# Patient Record
Sex: Female | Born: 1980 | Race: Black or African American | Hispanic: No | Marital: Single | Smoking: Never smoker
Health system: Southern US, Community
[De-identification: ages and names within clinical notes are randomized; demographics above are authoritative.]

## PROBLEM LIST (undated history)

## (undated) DIAGNOSIS — D649 Anemia, unspecified: Secondary | ICD-10-CM

## (undated) DIAGNOSIS — K589 Irritable bowel syndrome without diarrhea: Secondary | ICD-10-CM

## (undated) DIAGNOSIS — J45909 Unspecified asthma, uncomplicated: Secondary | ICD-10-CM

## (undated) DIAGNOSIS — K439 Ventral hernia without obstruction or gangrene: Secondary | ICD-10-CM

## (undated) DIAGNOSIS — F32A Depression, unspecified: Secondary | ICD-10-CM

## (undated) DIAGNOSIS — K3184 Gastroparesis: Secondary | ICD-10-CM

## (undated) DIAGNOSIS — Z8679 Personal history of other diseases of the circulatory system: Secondary | ICD-10-CM

## (undated) DIAGNOSIS — F329 Major depressive disorder, single episode, unspecified: Secondary | ICD-10-CM

## (undated) DIAGNOSIS — Z973 Presence of spectacles and contact lenses: Secondary | ICD-10-CM

## (undated) DIAGNOSIS — K4090 Unilateral inguinal hernia, without obstruction or gangrene, not specified as recurrent: Secondary | ICD-10-CM

## (undated) DIAGNOSIS — L309 Dermatitis, unspecified: Secondary | ICD-10-CM

## (undated) DIAGNOSIS — F419 Anxiety disorder, unspecified: Secondary | ICD-10-CM

## (undated) DIAGNOSIS — R519 Headache, unspecified: Secondary | ICD-10-CM

## (undated) DIAGNOSIS — G709 Myoneural disorder, unspecified: Secondary | ICD-10-CM

## (undated) HISTORY — DX: Dermatitis, unspecified: L30.9

## (undated) HISTORY — PX: HERNIA REPAIR: SHX51

---

## 2016-10-04 ENCOUNTER — Emergency Department (HOSPITAL_COMMUNITY)
Admission: EM | Admit: 2016-10-04 | Discharge: 2016-10-04 | Disposition: A | Discharge status: Home / Self Care | Payer: Self-pay | Attending: Emergency Medicine | Admitting: Emergency Medicine

## 2016-10-04 ENCOUNTER — Encounter (HOSPITAL_COMMUNITY): Payer: Self-pay

## 2016-10-04 DIAGNOSIS — Z79899 Other long term (current) drug therapy: Secondary | ICD-10-CM | POA: Insufficient documentation

## 2016-10-04 DIAGNOSIS — R0982 Postnasal drip: Secondary | ICD-10-CM | POA: Insufficient documentation

## 2016-10-04 DIAGNOSIS — J45909 Unspecified asthma, uncomplicated: Secondary | ICD-10-CM | POA: Insufficient documentation

## 2016-10-04 DIAGNOSIS — J029 Acute pharyngitis, unspecified: Secondary | ICD-10-CM | POA: Insufficient documentation

## 2016-10-04 HISTORY — DX: Irritable bowel syndrome, unspecified: K58.9

## 2016-10-04 HISTORY — DX: Unspecified asthma, uncomplicated: J45.909

## 2016-10-04 HISTORY — DX: Gastroparesis: K31.84

## 2016-10-04 LAB — RAPID STREP SCREEN (MED CTR MEBANE ONLY): STREPTOCOCCUS, GROUP A SCREEN (DIRECT): NEGATIVE

## 2016-10-04 MED ORDER — BENZONATATE 100 MG PO CAPS: ORAL_CAPSULE | ORAL | 1 refills | Status: DC

## 2016-10-04 MED ORDER — NAPROXEN 500 MG PO TABS: 500.0000 mg | ORAL_TABLET | Freq: Two times a day (BID) | ORAL | 0 refills | Status: DC

## 2016-10-04 NOTE — Discharge Instructions (Signed)
The rapid strep test was negative. Your symptoms are consistent with a viral illness complicated by sinus congestion and drainage, likely due to the environment. Viruses do not require antibiotics. Treatment is symptomatic care. Drink plenty of fluids and get plenty of rest. You should be drinking at least half a liter of water an hour to stay hydrated. Electrolyte drinks are also encouraged. Ibuprofen, Naproxen, or Tylenol for pain or fever. Tessalon for cough. Plain Mucinex or phenylephrine (Sudafed) may help relieve congestion. Sinus rinses to alleviate sinus congestion. Warm liquids or Chloraseptic spray may help soothe a sore throat. Follow up with a primary care provider, as needed, for any future management of this issue.

## 2016-10-04 NOTE — ED Provider Notes (Signed)
[facility] DEPT Provider Note   CSN: [id number] Arrival date & time: 10/04/16  1057  By signing my name below, I, [name], attest that this documentation has been prepared under the direction and in the presence of Jacen Carlini, PA-C. Electronically Signed: [name] , ED Scribe. 10/04/16. 12:23 PM.   History   Chief Complaint Chief Complaint  Patient presents with  . sore throat/cough    HPI Virginia [name] is a 36 y.o. female with hx of asthma, thyroid cyst who presents to the Emergency Department complaining of intermittent mild to moderate sore throat with gradual onset x2 weeks. She also endorses nasal congestion, sinus congestion, postnasal drip, sneezing, and cough. This morning she looked at her throat in the mirror and thought her uvula looked swollen. Pt states that her sore throat is worse in the morning and has taken Tylenol cough and cold with mild relief. She denies fever/chills, N/V, chest pain, difficulty breathing or swallowing, or any other complaints.   Of note, this is pt[place] first winter in [place] after moving from [place].     The history is provided by the patient. No language interpreter was used.    Past Medical History:  Diagnosis Date  . Asthma   . Gastroparesis   . IBS (irritable bowel syndrome)     There are no active problems to display for this patient.   History reviewed. No pertinent surgical history.  OB History    No data available       Home Medications    Prior to Admission medications   Medication Sig Start Date End Date Taking? Authorizing Provider  benzonatate (TESSALON) 100 MG capsule Take 1-2 capsules (100-200 mg) every 8 hours as needed for cough. 10/04/16   Louine Tenpenny C Samy Ryner, PA-C  naproxen (NAPROSYN) 500 MG tablet Take 1 tablet (500 mg total) by mouth 2 (two) times daily. 10/04/16   Anselm PancoastShawn C Gurman [place], PA-C    Family History No family history on file.  Social History Social History  Substance Use Topics  . Smoking  status: Never Smoker  . Smokeless tobacco: Never Used  . Alcohol use Not on file     Allergies   Patient has no known allergies.   Review of Systems Review of Systems  Constitutional: Negative for chills and fever.  HENT: Positive for congestion, rhinorrhea, sinus pressure and sore throat. Negative for facial swelling and voice change.   Respiratory: Positive for cough. Negative for shortness of breath.   Cardiovascular: Negative for chest pain.  Gastrointestinal: Negative for abdominal pain, nausea and vomiting.  Neurological: Positive for headaches.  All other systems reviewed and are negative.    Physical Exam Updated Vital Signs BP 127/83 (BP Location: Left Arm)   Pulse 73   Temp 98.1 F (36.7 C) (Oral)   Resp 18   SpO2 100%   Physical Exam  Constitutional: She appears well-developed and well-nourished. No distress.  HENT:  Head: Normocephalic and atraumatic.  Mouth/Throat: Uvula is midline and mucous membranes are normal. No trismus in the jaw. No uvula swelling. Posterior oropharyngeal erythema present. No oropharyngeal exudate, posterior oropharyngeal edema or tonsillar abscesses.  No tenderness over the frontal or maxillary sinuses.  Eyes: Conjunctivae are normal.  Neck: Normal range of motion. Neck supple.  Cardiovascular: Normal rate, regular rhythm, normal heart sounds and intact distal pulses.   Pulmonary/Chest: Effort normal and breath sounds normal. No respiratory distress. She has no wheezes. She has no rhonchi.  Abdominal: Soft. There is no tenderness.  There is no guarding.  Musculoskeletal: She exhibits no edema.  Lymphadenopathy:    She has cervical adenopathy.  Neurological: She is alert.  Skin: Skin is warm and dry. She is not diaphoretic.  Psychiatric: She has a normal mood and affect. Her behavior is normal.  Nursing note and vitals reviewed.    ED Treatments / Results   DIAGNOSTIC STUDIES: Oxygen Saturation is 100% on RA, normal by my  interpretation.   COORDINATION OF CARE: 12:23 PM-Discussed next steps with pt. Pt verbalized understanding and is agreeable with the plan.    Labs (all labs ordered are listed, but only abnormal results are displayed) Labs Reviewed  RAPID STREP SCREEN (NOT AT [facility])  CULTURE, GROUP A STREP [facility])    EKG  EKG Interpretation None       Radiology No results found.  Procedures Procedures (including critical care time)  Medications Ordered in ED Medications - No data to display   Initial Impression / Assessment and Plan / ED Course  I have reviewed the triage vital signs and the nursing notes.  Pertinent labs & imaging results that were available during my care of the patient were reviewed by me and considered in my medical decision making (see chart for details).  Clinical Course     Patient's symptoms are consistent with a combination of possible URI and environmental allergies. Patient is nontoxic appearing and has normal vital signs. Rapid strep negative. Symptomatic care and return precautions discussed. Patient voiced understanding of all instructions and is comfortable with discharge.     Final Clinical Impressions(s) / ED Diagnoses   Final diagnoses:  Pharyngitis, unspecified etiology  Postnasal drip    New Prescriptions New Prescriptions   BENZONATATE (TESSALON) 100 MG CAPSULE    Take 1-2 capsules (100-200 mg) every 8 hours as needed for cough.   NAPROXEN (NAPROSYN) 500 MG TABLET    Take 1 tablet (500 mg total) by mouth 2 (two) times daily.   I personally performed the services described in this documentation, which was scribed in my presence. The recorded information has been reviewed and is accurate.    Anselm Pancoast, PA-C 10/04/16 1223    Lorre Nick, MD 10/06/16 [phone]

## 2016-10-04 NOTE — ED Notes (Signed)
Declined W/C at D/C and was escorted to lobby by RN. 

## 2016-10-04 NOTE — ED Triage Notes (Signed)
Patient here with sore throat and cough for the past week. States that the throat pain is better but noticed her uvula was swollen this am. NAD

## 2016-10-07 LAB — CULTURE, GROUP A STREP (THRC)

## 2017-02-25 ENCOUNTER — Encounter (HOSPITAL_COMMUNITY): Payer: Self-pay

## 2017-02-25 ENCOUNTER — Emergency Department (HOSPITAL_COMMUNITY)
Admission: EM | Admit: 2017-02-25 | Discharge: 2017-02-25 | Disposition: A | Discharge status: Home / Self Care | Payer: Self-pay | Attending: Emergency Medicine | Admitting: Emergency Medicine

## 2017-02-25 DIAGNOSIS — J45909 Unspecified asthma, uncomplicated: Secondary | ICD-10-CM | POA: Insufficient documentation

## 2017-02-25 DIAGNOSIS — Z79899 Other long term (current) drug therapy: Secondary | ICD-10-CM | POA: Insufficient documentation

## 2017-02-25 DIAGNOSIS — J069 Acute upper respiratory infection, unspecified: Secondary | ICD-10-CM | POA: Insufficient documentation

## 2017-02-25 DIAGNOSIS — J029 Acute pharyngitis, unspecified: Secondary | ICD-10-CM | POA: Insufficient documentation

## 2017-02-25 HISTORY — DX: Depression, unspecified: F32.A

## 2017-02-25 HISTORY — DX: Anxiety disorder, unspecified: F41.9

## 2017-02-25 HISTORY — DX: Ventral hernia without obstruction or gangrene: K43.9

## 2017-02-25 HISTORY — DX: Major depressive disorder, single episode, unspecified: F32.9

## 2017-02-25 LAB — RAPID STREP SCREEN (MED CTR MEBANE ONLY): Streptococcus, Group A Screen (Direct): NEGATIVE

## 2017-02-25 MED ORDER — ALBUTEROL SULFATE (2.5 MG/3ML) 0.083% IN NEBU
5.0000 mg | INHALATION_SOLUTION | Freq: Once | RESPIRATORY_TRACT | Status: AC
  Administered 2017-02-25: 5 mg via RESPIRATORY_TRACT
  Filled 2017-02-25: qty 6

## 2017-02-25 MED ORDER — IPRATROPIUM BROMIDE 0.02 % IN SOLN
0.5000 mg | Freq: Once | RESPIRATORY_TRACT | Status: AC
  Administered 2017-02-25: 0.5 mg via RESPIRATORY_TRACT
  Filled 2017-02-25: qty 2.5

## 2017-02-25 MED ORDER — AZITHROMYCIN 250 MG PO TABS: ORAL_TABLET | ORAL | 0 refills | Status: DC

## 2017-02-25 NOTE — ED Notes (Signed)
Patient agitated that she has been waiting in the lobby and now waiting in the room. Explained that provider would be in soon and that she is aware that patient is here and her medical complaints. Pt cursing and verbally voicing dissatisfaction stating "my asthma is acting up and I can't breath. Francesca OmanYall going to let me die." Advised patient to calm down and take deep breaths as this makes her breathing more stressful. Lungs sounds were assessed earlier and reassessed. Lungs clear, no wheezing, and patient obviously can talk in complete sentences. She calmed down and is now playing on her phone and laughing at a video by the time I departed from the room. Warm blanket given for comfort.

## 2017-02-25 NOTE — ED Triage Notes (Signed)
For over one week sore throat and pressure in ears no fever voiced no drooling non productive cough noted able to speak in full sentences no wheezing noted.

## 2017-02-27 LAB — CULTURE, GROUP A STREP (THRC)

## 2017-03-14 NOTE — ED Provider Notes (Signed)
[facility] Provider Note   CSN: [id number] Arrival date & time: 02/25/17  0209     History   Chief Complaint Chief Complaint  Patient presents with  . Sore Throat    HPI Virginia [name] is a 36 y.o. female.  Patient presents with complaint of sore throat, dry cough and ear pressure x 1 week. She feels her asthma is causing wheezing and SOB. No nausea, vomiting, headache, diarrhea. No fever at any time. She uses Albuterol by inhaler at home and states she has needed to use it more frequently recently.    The history is provided by the patient. No language interpreter was used.    Past Medical History:  Diagnosis Date  . Anxiety   . Asthma   . Depression   . Gastroparesis   . Hernia of abdominal wall   . IBS (irritable bowel syndrome)     There are no active problems to display for this patient.   History reviewed. No pertinent surgical history.  OB History    No data available       Home Medications    Prior to Admission medications   Medication Sig Start Date End Date Taking? Authorizing Provider  albuterol (PROVENTIL HFA;VENTOLIN HFA) 108 (90 Base) MCG/ACT inhaler Inhale 1-2 puffs into the lungs every 6 (six) hours as needed for wheezing or shortness of breath.   Yes [provider]  Phenyleph-Doxylamine-DM-APAP (TYLENOL COLD MULTI-SYMPTOM) 5-6.25-10-325 MG/15ML LIQD Take 15 mLs by mouth as needed (cold).   Yes [provider]  Pseudoephedrine-APAP-DM (DAYQUIL PO) Take 1 capsule by mouth every 6 (six) hours as needed (cold).   Yes [provider]  azithromycin (ZITHROMAX Z-PAK) 250 MG tablet Take 2 tablets on day 1 Take 1 tablet on days 2-5 02/25/17   Donnalyn Juran, PA-C  benzonatate (TESSALON) 100 MG capsule Take 1-2 capsules (100-200 mg) every 8 hours as needed for cough. Patient not taking: Reported on 02/25/2017 10/04/16   Joy, Ines Bloomer C, PA-C  naproxen (NAPROSYN) 500 MG tablet Take 1 tablet (500 mg total) by mouth 2 (two) times  daily. Patient not taking: Reported on 02/25/2017 10/04/16   Anselm Pancoast, PA-C    Family History History reviewed. No pertinent family history.  Social History Social History  Substance Use Topics  . Smoking status: Never Smoker  . Smokeless tobacco: Never Used  . Alcohol use Not on file     Allergies   Patient has no known allergies.   Review of Systems Review of Systems  Constitutional: Negative for chills and fever.  HENT: Positive for ear pain and sore throat. Negative for sinus pressure and trouble swallowing.   Respiratory: Positive for cough, shortness of breath and wheezing.   Cardiovascular: Negative.  Negative for chest pain.  Gastrointestinal: Negative.  Negative for nausea and vomiting.  Musculoskeletal: Negative.  Negative for myalgias.  Neurological: Negative.      Physical Exam Updated Vital Signs BP (!) 144/89 (BP Location: Left Arm)   Pulse 77   Temp 98.5 F (36.9 C) (Oral)   Resp 18   Ht 5\' 2"  (1.575 m)   Wt 80.3 kg (177 lb)   SpO2 100%   BMI 32.37 kg/m   Physical Exam  Constitutional: She is oriented to person, place, and time. She appears well-developed and well-nourished.  HENT:  Head: Normocephalic.  Oropharynx slightly erythematous. No significant tonsillar swelling and no exudates present. Uvula midline.  Neck: Normal range of motion. Neck supple.  Cardiovascular: Normal  rate and regular rhythm.   No murmur heard. Pulmonary/Chest: Effort normal and breath sounds normal. She has no wheezes. She has no rales.  Abdominal: Soft. Bowel sounds are normal. There is no tenderness. There is no rebound and no guarding.  Musculoskeletal: Normal range of motion.  Neurological: She is alert and oriented to person, place, and time.  Skin: Skin is warm and dry. No rash noted.  Psychiatric: She has a normal mood and affect.     ED Treatments / Results  Labs (all labs ordered are listed, but only abnormal results are displayed) Labs Reviewed    RAPID STREP SCREEN (NOT AT [facility])  CULTURE, GROUP A STREP [facility]    EKG  EKG Interpretation None       Radiology No results found.  Procedures Procedures (including critical care time)  Medications Ordered in ED Medications  albuterol (PROVENTIL) (2.5 MG/3ML) 0.083% nebulizer solution 5 mg (5 mg Nebulization Given 02/25/17 0516)  ipratropium (ATROVENT) nebulizer solution 0.5 mg (0.5 mg Nebulization Given 02/25/17 0516)     Initial Impression / Assessment and Plan / ED Course  I have reviewed the triage vital signs and the nursing notes.  Pertinent labs & imaging results that were available during my care of the patient were reviewed by me and considered in my medical decision making (see chart for details).     The patient is given a breathing treatment with albuterol and atrovent for subjective SOB and wheezing though none auscultated on exam. She reports this helped her feel better. She has a benign exam and strep screen is negative. She can be discharged home with supportive care.   Final Clinical Impressions(s) / ED Diagnoses   Final diagnoses:  Acute pharyngitis, unspecified etiology  Viral upper respiratory tract infection    New Prescriptions Discharge Medication List as of 02/25/2017  6:10 AM    START taking these medications   Details  azithromycin (ZITHROMAX Z-PAK) 250 MG tablet Take 2 tablets on day 1 Take 1 tablet on days 2-5, Print         Elpidio AnisUpstill, Julizza Sassone, PA-C 03/14/17 2232    Ward, Layla MawKristen N, DO 03/14/17 2303

## 2017-10-21 ENCOUNTER — Encounter (HOSPITAL_COMMUNITY): Payer: Self-pay

## 2017-10-21 ENCOUNTER — Other Ambulatory Visit: Payer: Self-pay

## 2017-10-21 DIAGNOSIS — R1013 Epigastric pain: Secondary | ICD-10-CM | POA: Insufficient documentation

## 2017-10-21 DIAGNOSIS — J45909 Unspecified asthma, uncomplicated: Secondary | ICD-10-CM | POA: Insufficient documentation

## 2017-10-21 DIAGNOSIS — F329 Major depressive disorder, single episode, unspecified: Secondary | ICD-10-CM | POA: Insufficient documentation

## 2017-10-21 DIAGNOSIS — R1011 Right upper quadrant pain: Secondary | ICD-10-CM | POA: Insufficient documentation

## 2017-10-21 DIAGNOSIS — F419 Anxiety disorder, unspecified: Secondary | ICD-10-CM | POA: Insufficient documentation

## 2017-10-21 LAB — CBC
HCT: 32.6 % — ABNORMAL LOW (ref 36.0–46.0)
Hemoglobin: 10.7 g/dL — ABNORMAL LOW (ref 12.0–15.0)
MCH: 28.2 pg (ref 26.0–34.0)
MCHC: 32.8 g/dL (ref 30.0–36.0)
MCV: 85.8 fL (ref 78.0–100.0)
PLATELETS: 305 10*3/uL (ref 150–400)
RBC: 3.8 MIL/uL — ABNORMAL LOW (ref 3.87–5.11)
RDW: 12.5 % (ref 11.5–15.5)
WBC: 5.8 10*3/uL (ref 4.0–10.5)

## 2017-10-21 LAB — I-STAT BETA HCG BLOOD, ED (MC, WL, AP ONLY)

## 2017-10-21 LAB — COMPREHENSIVE METABOLIC PANEL
ALK PHOS: 65 U/L (ref 38–126)
ALT: 15 U/L (ref 14–54)
AST: 19 U/L (ref 15–41)
Albumin: 3.5 g/dL (ref 3.5–5.0)
Anion gap: 9 (ref 5–15)
BILIRUBIN TOTAL: 0.6 mg/dL (ref 0.3–1.2)
BUN: 14 mg/dL (ref 6–20)
CALCIUM: 8.6 mg/dL — AB (ref 8.9–10.3)
CO2: 23 mmol/L (ref 22–32)
CREATININE: 0.72 mg/dL (ref 0.44–1.00)
Chloride: 102 mmol/L (ref 101–111)
Glucose, Bld: 85 mg/dL (ref 65–99)
Potassium: 3.6 mmol/L (ref 3.5–5.1)
SODIUM: 134 mmol/L — AB (ref 135–145)
TOTAL PROTEIN: 7.8 g/dL (ref 6.5–8.1)

## 2017-10-21 LAB — LIPASE, BLOOD: Lipase: 71 U/L — ABNORMAL HIGH (ref 11–51)

## 2017-10-21 NOTE — ED Notes (Signed)
Lab work, radiology results and vital signs reviewed, no critical results at this time, no change in acuity indicated.  

## 2017-10-21 NOTE — ED Triage Notes (Signed)
Pt states she has had RUQ pain X2 weeks. Pt states it is more painful when laying on that side. Pt denies n/v at this time. Pt states today she had increased pain with eating.

## 2017-10-22 ENCOUNTER — Emergency Department (HOSPITAL_COMMUNITY): Payer: Self-pay

## 2017-10-22 ENCOUNTER — Emergency Department (HOSPITAL_COMMUNITY)
Admission: EM | Admit: 2017-10-22 | Discharge: 2017-10-22 | Disposition: A | Discharge status: Home / Self Care | Payer: Self-pay | Attending: Emergency Medicine | Admitting: Emergency Medicine

## 2017-10-22 DIAGNOSIS — R1013 Epigastric pain: Secondary | ICD-10-CM

## 2017-10-22 DIAGNOSIS — R1011 Right upper quadrant pain: Secondary | ICD-10-CM

## 2017-10-22 LAB — URINALYSIS, ROUTINE W REFLEX MICROSCOPIC
BILIRUBIN URINE: NEGATIVE
GLUCOSE, UA: NEGATIVE mg/dL
HGB URINE DIPSTICK: NEGATIVE
Ketones, ur: 80 mg/dL — AB
LEUKOCYTES UA: NEGATIVE
Nitrite: NEGATIVE
PH: 6 (ref 5.0–8.0)
PROTEIN: NEGATIVE mg/dL
Specific Gravity, Urine: 1.027 (ref 1.005–1.030)

## 2017-10-22 MED ORDER — MORPHINE SULFATE (PF) 4 MG/ML IV SOLN
4.0000 mg | Freq: Once | INTRAVENOUS | Status: AC
  Administered 2017-10-22: 4 mg via INTRAVENOUS
  Filled 2017-10-22: qty 1

## 2017-10-22 MED ORDER — SODIUM CHLORIDE 0.9 % IV BOLUS (SEPSIS)
1000.0000 mL | Freq: Once | INTRAVENOUS | Status: AC
  Administered 2017-10-22: 1000 mL via INTRAVENOUS

## 2017-10-22 MED ORDER — PANTOPRAZOLE SODIUM 40 MG PO TBEC
40.0000 mg | DELAYED_RELEASE_TABLET | Freq: Once | ORAL | Status: AC
  Administered 2017-10-22: 40 mg via ORAL
  Filled 2017-10-22: qty 1

## 2017-10-22 MED ORDER — GI COCKTAIL ~~LOC~~
30.0000 mL | Freq: Once | ORAL | Status: AC
  Administered 2017-10-22: 30 mL via ORAL
  Filled 2017-10-22: qty 30

## 2017-10-22 MED ORDER — PANTOPRAZOLE SODIUM 40 MG PO TBEC: 40.0000 mg | DELAYED_RELEASE_TABLET | Freq: Every day | ORAL | 1 refills | Status: DC

## 2017-10-22 MED ORDER — IOPAMIDOL (ISOVUE-300) INJECTION 61%
INTRAVENOUS | Status: AC
  Administered 2017-10-22: 100 mL
  Filled 2017-10-22: qty 100

## 2017-10-22 MED ORDER — ONDANSETRON HCL 4 MG/2ML IJ SOLN
4.0000 mg | Freq: Once | INTRAMUSCULAR | Status: AC
  Administered 2017-10-22: 4 mg via INTRAVENOUS
  Filled 2017-10-22: qty 2

## 2017-10-22 MED ORDER — PROMETHAZINE HCL 25 MG PO TABS: 25.0000 mg | ORAL_TABLET | Freq: Four times a day (QID) | ORAL | 0 refills | Status: DC | PRN

## 2017-10-22 MED ORDER — SUCRALFATE 1 GM/10ML PO SUSP: 1.0000 g | Freq: Three times a day (TID) | ORAL | 0 refills | Status: DC

## 2017-10-22 NOTE — ED Provider Notes (Addendum)
TIME SEEN: 1:47 AM  CHIEF COMPLAINT: Abdominal pain  HPI: Patient is a 37 year old female with history of anxiety, asthma, depression, gastroparesis who presents to the emergency department with complaints of right upper quadrant abdominal pain.  Pain present for the past 2 weeks and is worse with eating.  Denies any changes with any particular foods.  She states it is better when she is lying flat.  No fevers, chills.  Has not nausea but no vomiting or diarrhea.  No dysuria, hematuria, vaginal bleeding or discharge.  Has had previous abdominal hernia repair.  Has never been evaluated by a doctor for this pain.  States because she has not eaten much today she is having a throbbing headache.  No head injury.  No numbness, tingling or focal weakness.  ROS: See HPI Constitutional: no fever  Eyes: no drainage  ENT: no runny nose   Cardiovascular:  no chest pain  Resp: no SOB  GI: no vomiting GU: no dysuria Integumentary: no rash  Allergy: no hives  Musculoskeletal: no leg swelling  Neurological: no slurred speech ROS otherwise negative  PAST MEDICAL HISTORY/PAST SURGICAL HISTORY:  Past Medical History:  Diagnosis Date  . Anxiety   . Asthma   . Depression   . Gastroparesis   . Hernia of abdominal wall   . IBS (irritable bowel syndrome)     MEDICATIONS:  Prior to Admission medications   Medication Sig Start Date End Date Taking? Authorizing Provider  azithromycin (ZITHROMAX Z-PAK) 250 MG tablet Take 2 tablets on day 1 Take 1 tablet on days 2-5 Patient not taking: Reported on 10/22/2017 02/25/17   Elpidio AnisUpstill, Shari, PA-C  benzonatate (TESSALON) 100 MG capsule Take 1-2 capsules (100-200 mg) every 8 hours as needed for cough. Patient not taking: Reported on 02/25/2017 10/04/16   Joy, Ines BloomerShawn C, PA-C  naproxen (NAPROSYN) 500 MG tablet Take 1 tablet (500 mg total) by mouth 2 (two) times daily. Patient not taking: Reported on 02/25/2017 10/04/16   Anselm PancoastJoy, Shawn C, PA-C    ALLERGIES:  No Known  Allergies  SOCIAL HISTORY:  Social History   Tobacco Use  . Smoking status: Never Smoker  . Smokeless tobacco: Never Used  Substance Use Topics  . Alcohol use: Yes    Frequency: Never    Comment: occ    FAMILY HISTORY: History reviewed. No pertinent family history.  EXAM: BP 139/87   Pulse (!) 48   Temp 98.3 F (36.8 C) (Oral)   Resp 12   LMP 10/13/2017 (Exact Date)   SpO2 100%  CONSTITUTIONAL: Alert and oriented and responds appropriately to questions. Well-appearing; well-nourished HEAD: Normocephalic EYES: Conjunctivae clear, pupils appear equal, EOMI, patient has photophobia ENT: normal nose; moist mucous membranes NECK: Supple, no meningismus, no nuchal rigidity, no LAD  CARD: RRR; S1 and S2 appreciated; no murmurs, no clicks, no rubs, no gallops RESP: Normal chest excursion without splinting or tachypnea; breath sounds clear and equal bilaterally; no wheezes, no rhonchi, no rales, no hypoxia or respiratory distress, speaking full sentences ABD/GI: Normal bowel sounds; non-distended; soft, tender in the right upper quadrant with negative Murphy sign, no rebound, no guarding, no peritoneal signs, no hepatosplenomegaly McBurney's point BACK:  The back appears normal and is non-tender to palpation, there is no CVA tenderness EXT: Normal ROM in all joints; non-tender to palpation; no edema; normal capillary refill; no cyanosis, no calf tenderness or swelling    SKIN: Normal color for age and race; warm; no rash NEURO: Moves all extremities equally,  sensation to light touch intact diffusely, cranial nerves II through XII intact, normal speech PSYCH: The patient's mood and manner are appropriate. Grooming and personal hygiene are appropriate.  MEDICAL DECISION MAKING: Patient here with complaints of right upper quadrant abdominal pain.  Suspect cholelithiasis.  Less likely cholecystitis.  Lipase mildly elevated but nothing else to suggest choledocholithiasis.  Pancreatitis also  in the differential.  Doubt appendicitis, colitis, bowel obstruction, diverticulitis.  Also complaining of headache.  Has had similar headaches before.  Doubt intracranial hemorrhage, meningitis, stroke.  Urine suggest dehydration.  Will give 2 L of IV fluids.  Will give morphine and Zofran for symptomatic relief.  Will obtain right upper quadrant ultrasound.  ED PROGRESS: Patient's ultrasound is normal.  Still having significant upper abdominal pain and given mildly elevated lipase will obtain CT of the abdomen pelvis for further evaluation especially of pancreatitis.  Patient comfortable with this plan.  Will give second dose of morphine.  This also could be gastritis.  Reports she has had previous negative endoscopy in the past.    5:45 AM  Pt's CT scan shows diffuse fatty infiltration of the liver and uterine fibroids but otherwise no abnormalities.  Appendix appears normal.  Have recommended follow-up with gastroenterology if symptoms continue for endoscopy.  Will discharge on Protonix and with prescriptions of Carafate and phenergan.  I feel she is safe to be discharged home.  Will give outpatient PCP follow-up as well.  She now endorses to me that she has gastroparesis.  This may be gastroparesis causing her symptoms.  No vomiting here in the emergency department.  States her endoscopy previously was in [place].  She has received 2 L of IV fluids and is tolerating oral fluid at this time.  Will give GI follow-up here in [place].   At this time, I do not feel there is any life-threatening condition present. I have reviewed and discussed all results (EKG, imaging, lab, urine as appropriate) and exam findings with patient/family. I have reviewed nursing notes and appropriate previous records.  I feel the patient is safe to be discharged home without further emergent workup and can continue workup as an outpatient as needed. Discussed usual and customary return precautions. Patient/family  verbalize understanding and are comfortable with this plan.  Outpatient follow-up has been provided if needed. All questions have been answered.    Ward, Layla Maw, DO 10/22/17 0548    Ward, Layla Maw, DO 10/22/17 [phone]

## 2017-10-22 NOTE — ED Notes (Signed)
ED Provider at bedside. 

## 2017-10-22 NOTE — ED Notes (Signed)
Pt came up to the front desk complaining of the text messages that she is getting because they keep saying that someone is coming to update her and that she has only been checked on for vital signs. Pt very upset about the wait time and that she was not made comfortable in the waiting room. Staff apologized for the wait time and let her know that she is one of the next people to move back.

## 2017-10-22 NOTE — ED Notes (Addendum)
Pt escorted to hallway bed, pt not happy with this. Apologized, explained to patient that she will be seen faster because of this. Escorted patient to bathroom and apologized again for delay.

## 2017-10-22 NOTE — Discharge Instructions (Signed)
Please avoid NSAIDs such as aspirin (Goody powders), ibuprofen (Motrin, Advil), naproxen (Aleve) as these may worsen your symptoms.  Tylenol 1000 mg every 6 hours is safe to take as long as you have no history of liver problems (heavy alcohol use, cirrhosis, hepatitis).  Please avoid spicy, acidic (citrus fruits, tomato based sauces, salsa), greasy, fatty foods.  Please avoid caffeine and alcohol.     Your labs, abdominal ultrasound and CT scan today were normal other than fatty liver disease seen on CT scan.  This does not cause abdominal pain.  Recommend you avoid alcohol and eat a low-fat diet.  If pain continues, please follow-up with gastroenterology as you may need an endoscopy as an outpatient.    To find a primary care or specialty doctor please call [phone] or [phone] to access "[place] Find a Doctor Service."  You may also go on the [facility] website at [email or web address]  There are also multiple [facility], [facility], [facility] and [facility] practices throughout the [place] that are frequently accepting new patients. You may find a clinic that is close to your home and contact them.  [facility] -  [address] [place] [id number] [phone]   [facility] -  [address] [place] [id number] [phone]   [facility] [phone]  [place] [phone]

## 2017-10-22 NOTE — ED Notes (Signed)
Pt c/o light sensitive migraine. Moved to treatment room, lights turned down. Charge RN made aware.

## 2017-12-31 ENCOUNTER — Ambulatory Visit (HOSPITAL_COMMUNITY)
Admission: EM | Admit: 2017-12-31 | Discharge: 2017-12-31 | Disposition: A | Discharge status: Home / Self Care | Payer: BLUE CROSS/BLUE SHIELD | Attending: Internal Medicine | Admitting: Internal Medicine

## 2017-12-31 ENCOUNTER — Encounter (HOSPITAL_COMMUNITY): Payer: Self-pay | Admitting: Family Medicine

## 2017-12-31 DIAGNOSIS — J111 Influenza due to unidentified influenza virus with other respiratory manifestations: Secondary | ICD-10-CM

## 2017-12-31 DIAGNOSIS — R69 Illness, unspecified: Secondary | ICD-10-CM | POA: Diagnosis not present

## 2017-12-31 DIAGNOSIS — J209 Acute bronchitis, unspecified: Secondary | ICD-10-CM

## 2017-12-31 MED ORDER — BENZONATATE 200 MG PO CAPS: 200.0000 mg | ORAL_CAPSULE | Freq: Three times a day (TID) | ORAL | 0 refills | Status: DC | PRN

## 2017-12-31 MED ORDER — ONDANSETRON HCL 4 MG PO TABS: 8.0000 mg | ORAL_TABLET | ORAL | 0 refills | Status: DC | PRN

## 2017-12-31 MED ORDER — PREDNISONE 50 MG PO TABS: 50.0000 mg | ORAL_TABLET | Freq: Every day | ORAL | 0 refills | Status: DC

## 2017-12-31 NOTE — Discharge Instructions (Addendum)
Anticipate gradual improvement in cough, well being, stomach upset, over the next several days.  Cough may take a couple weeks to subside.  Prescriptions for prednisone (steroid, for cough, history of asthma), ondansetron (for nausea), and benzonatate (for cough) were sent to the pharmacy.  Recheck for persistent (>3-4 more days) fever >100.5, increasing phlegm production/nasal discharge, or if not starting to improve in a few days.   Note for work today.

## 2017-12-31 NOTE — ED Triage Notes (Signed)
Pt here for body aches, vomiting, nausea, chest pain, weakness and cough since Monday. Coughing up mucous. She has been using nebulizer 2 treatments a day.

## 2017-12-31 NOTE — ED Provider Notes (Signed)
[facility]    CSN: [id number] Arrival date & time: 12/31/17  1534     History   Chief Complaint Chief Complaint  Patient presents with  . Generalized Body Aches  . Nausea    HPI Virginia [name] is a 37 y.o. female. presents today with 4d history of wet cough, tactile temp, runny/congested nose.  Persistent pain in RUQ, negative ED workup recently.  Hx asthma, IBS/gastroparesis.  Nausea, some vomiting, has been able to keeps sips of liquids/soup down.  An episode of diarrhea this morning.      HPI  Past Medical History:  Diagnosis Date  . Anxiety   . Asthma   . Depression   . Gastroparesis   . Hernia of abdominal wall   . IBS (irritable bowel syndrome)    History reviewed. No pertinent surgical history.    Home Medications    Prior to Admission medications   Medication Sig Start Date End Date Taking? Authorizing Provider  benzonatate (TESSALON) 200 MG capsule Take 1 capsule (200 mg total) by mouth 3 (three) times daily as needed for cough. 12/31/17   Isa RankinMurray, Laura Wilson, MD  ondansetron (ZOFRAN) 4 MG tablet Take 2 tablets (8 mg total) by mouth every 4 (four) hours as needed for nausea or vomiting. 12/31/17   Isa RankinMurray, Laura Wilson, MD  pantoprazole (PROTONIX) 40 MG tablet Take 1 tablet (40 mg total) by mouth daily. 10/22/17   Ward, Layla MawKristen N, DO  predniSONE (DELTASONE) 50 MG tablet Take 1 tablet (50 mg total) by mouth daily. 12/31/17   Isa RankinMurray, Laura Wilson, MD  sucralfate (CARAFATE) 1 GM/10ML suspension Take 10 mLs (1 g total) by mouth 4 (four) times daily -  with meals and at bedtime. 10/22/17   Ward, Layla MawKristen N, DO    Family History History reviewed. No pertinent family history.  Social History Social History   Tobacco Use  . Smoking status: Never Smoker  . Smokeless tobacco: Never Used  Substance Use Topics  . Alcohol use: Yes    Frequency: Never    Comment: occ  . Drug use: Yes    Types: Marijuana     Allergies   Patient has no known  allergies.   Review of Systems Review of Systems  All other systems reviewed and are negative.    Physical Exam Triage Vital Signs ED Triage Vitals  Enc Vitals Group     BP 12/31/17 1613 (!) 147/80     Pulse Rate 12/31/17 1613 85     Resp 12/31/17 1613 18     Temp 12/31/17 1613 98.6 F (37 C)     Temp src --      SpO2 12/31/17 1613 100 %     Weight --      Height --      Pain Score 12/31/17 1611 6     Pain Loc --    Updated Vital Signs BP (!) 147/80   Pulse 85   Temp 98.6 F (37 C)   Resp 18   LMP 12/04/2017   SpO2 100%  Physical Exam  Constitutional: She is oriented to person, place, and time. No distress.  HENT:  Head: Atraumatic.  B TMs are dull, no erythema Mod nasal congestion bilat Throat slightly injected  Eyes:  Conjugate gaze observed, no eye redness/discharge  Neck: Neck supple.  Cardiovascular: Normal rate and regular rhythm.  Pulmonary/Chest: No respiratory distress. She has no wheezes. She has no rales.  Coarse but symmetric breath sounds throughout  Abdominal: She exhibits no distension.  Musculoskeletal: Normal range of motion.  Neurological: She is alert and oriented to person, place, and time.  Skin: Skin is warm and dry.  Nursing note and vitals reviewed.    UC Treatments / Results   EKG None Radiology No results found.  Procedures Procedures (including critical care time) None today  Final Clinical Impressions(s) / UC Diagnoses   Final diagnoses:  Influenza-like illness  Bronchitis with bronchospasm   Anticipate gradual improvement in cough, well being, stomach upset, over the next several days.  Cough may take a couple weeks to subside.  Prescriptions for prednisone (steroid, for cough, history of asthma), ondansetron (for nausea), and benzonatate (for cough) were sent to the pharmacy.  Recheck for persistent (>3-4 more days) fever >100.5, increasing phlegm production/nasal discharge, or if not starting to improve in a few  days.   Note for work today.  ED Discharge Orders        Ordered    predniSONE (DELTASONE) 50 MG tablet  Daily     12/31/17 1638    ondansetron (ZOFRAN) 4 MG tablet  Every 4 hours PRN     12/31/17 1638    benzonatate (TESSALON) 200 MG capsule  3 times daily PRN     12/31/17 1638         Isa Rankin, MD 01/02/18 2028

## 2018-01-12 ENCOUNTER — Emergency Department (HOSPITAL_COMMUNITY): Payer: BLUE CROSS/BLUE SHIELD

## 2018-01-12 ENCOUNTER — Emergency Department (HOSPITAL_COMMUNITY)
Admission: EM | Admit: 2018-01-12 | Discharge: 2018-01-12 | Disposition: A | Discharge status: Home / Self Care | Payer: BLUE CROSS/BLUE SHIELD | Attending: Emergency Medicine | Admitting: Emergency Medicine

## 2018-01-12 DIAGNOSIS — J069 Acute upper respiratory infection, unspecified: Secondary | ICD-10-CM | POA: Diagnosis not present

## 2018-01-12 DIAGNOSIS — Z79899 Other long term (current) drug therapy: Secondary | ICD-10-CM | POA: Insufficient documentation

## 2018-01-12 DIAGNOSIS — R05 Cough: Secondary | ICD-10-CM | POA: Diagnosis present

## 2018-01-12 DIAGNOSIS — J45901 Unspecified asthma with (acute) exacerbation: Secondary | ICD-10-CM | POA: Insufficient documentation

## 2018-01-12 LAB — BASIC METABOLIC PANEL
Anion gap: 8 (ref 5–15)
BUN: 16 mg/dL (ref 6–20)
CALCIUM: 8.3 mg/dL — AB (ref 8.9–10.3)
CO2: 22 mmol/L (ref 22–32)
CREATININE: 0.79 mg/dL (ref 0.44–1.00)
Chloride: 107 mmol/L (ref 101–111)
GFR calc Af Amer: >60 mL/min (ref >60)
GLUCOSE: 91 mg/dL (ref 65–99)
Potassium: 3.3 mmol/L — ABNORMAL LOW (ref 3.5–5.1)
Sodium: 137 mmol/L (ref 135–145)

## 2018-01-12 LAB — CBC
HCT: 32.4 % — ABNORMAL LOW (ref 36.0–46.0)
Hemoglobin: 10.4 g/dL — ABNORMAL LOW (ref 12.0–15.0)
MCH: 28 pg (ref 26.0–34.0)
MCHC: 32.1 g/dL (ref 30.0–36.0)
MCV: 87.3 fL (ref 78.0–100.0)
PLATELETS: 297 10*3/uL (ref 150–400)
RBC: 3.71 MIL/uL — ABNORMAL LOW (ref 3.87–5.11)
RDW: 13.5 % (ref 11.5–15.5)
WBC: 9.2 10*3/uL (ref 4.0–10.5)

## 2018-01-12 LAB — I-STAT BETA HCG BLOOD, ED (MC, WL, AP ONLY): I-stat hCG, quantitative: <5 m[IU]/mL (ref <5)

## 2018-01-12 LAB — I-STAT TROPONIN, ED: TROPONIN I, POC: 0 ng/mL (ref 0.00–0.08)

## 2018-01-12 MED ORDER — ALBUTEROL SULFATE (2.5 MG/3ML) 0.083% IN NEBU
5.0000 mg | INHALATION_SOLUTION | Freq: Once | RESPIRATORY_TRACT | Status: AC
  Administered 2018-01-12: 5 mg via RESPIRATORY_TRACT
  Filled 2018-01-12: qty 6

## 2018-01-12 MED ORDER — BENZONATATE 100 MG PO CAPS: 100.0000 mg | ORAL_CAPSULE | Freq: Three times a day (TID) | ORAL | 0 refills | Status: DC

## 2018-01-12 MED ORDER — ALBUTEROL SULFATE (2.5 MG/3ML) 0.083% IN NEBU
2.5000 mg | INHALATION_SOLUTION | Freq: Once | RESPIRATORY_TRACT | Status: AC
  Administered 2018-01-12: 2.5 mg via RESPIRATORY_TRACT
  Filled 2018-01-12: qty 3

## 2018-01-12 MED ORDER — ACETAMINOPHEN 500 MG PO TABS
1000.0000 mg | ORAL_TABLET | Freq: Once | ORAL | Status: AC
  Administered 2018-01-12: 1000 mg via ORAL
  Filled 2018-01-12: qty 2

## 2018-01-12 MED ORDER — ALBUTEROL SULFATE HFA 108 (90 BASE) MCG/ACT IN AERS: 1.0000 | INHALATION_SPRAY | Freq: Four times a day (QID) | RESPIRATORY_TRACT | 0 refills | Status: DC | PRN

## 2018-01-12 NOTE — ED Provider Notes (Signed)
[facility] EMERGENCY DEPARTMENT Provider Note   CSN: [id number] Arrival date & time: 01/12/18  4098     History   Chief Complaint Chief Complaint  Patient presents with  . Chest Pain  . Asthma    HPI Virginia [name] is a 37 y.o. female.  HPI   She is a 36 year old female with a history of asthma, gastroparesis, IBS who presents the ED today complaining of a productive cough with yellow/green sputum, chills, sweats, shortness of breath, chest pressure to her entire chest.  Patient states that chest pressure started 3 days ago and hurts worse when she coughs.  She also has bilateral lower back pain in the rib distribution.  Patient states that she has had a productive cough for the last 2 weeks.  States she was seen at urgent care and was diagnosed with flulike illness about 1.5 weeks ago.  She was started on medication at that time which she states that improved her symptoms.  She was feeling back to her baseline up until 6 days ago when she states symptoms returned.  She began to have nausea and vomiting at that time.  States she has not vomited in the last 6 days.  Denies any diarrhea or blood in stool.  States she has had right upper quadrant abdominal pain for the last 3 months.  She was seen in the ED for this 10/22/17 and had a negative right upper quadrant abdominal ultrasound as well as negative CT abdomen pelvis at that time.  States she has continued to have sharp pains since then that are intermittent in nature and last 10-15 seconds at a time.  Denies any urinary or vaginal symptoms.  Has been using Mucinex as well as her nebulizer treatments at home with mild relief.   Patient received neb treatment prior to me seeing her states that her breathing does feel improved and her chest pressure is feeling improved as well but not completely resolved.  She feels that her symptoms are due to an asthma flare because of her recent upper respiratory infection.  Denies leg  pain/swelling, hemoptysis, recent surgery/trauma, recent long travel, personal hx of cancer, or hx of DVT/PE.   Past Medical History:  Diagnosis Date  . Anxiety   . Asthma   . Depression   . Gastroparesis   . Hernia of abdominal wall   . IBS (irritable bowel syndrome)     There are no active problems to display for this patient.   No past surgical history on file.   OB History   None      Home Medications    Prior to Admission medications   Medication Sig Start Date End Date Taking? Authorizing Provider  bismuth subsalicylate (PEPTO BISMOL) 262 MG chewable tablet Chew 524 mg by mouth as needed for indigestion.   Yes [provider]  chlorpheniramine (CHLOR-TRIMETON) 4 MG tablet Take 4 mg by mouth as needed for allergies.   Yes [provider]  FEROSUL 325 (65 Fe) MG tablet Take 325 mg by mouth daily. 11/22/17  Yes [provider]  ondansetron (ZOFRAN) 4 MG tablet Take 2 tablets (8 mg total) by mouth every 4 (four) hours as needed for nausea or vomiting. 12/31/17  Yes Isa Rankin, MD  VIENVA 0.1-20 MG-MCG tablet Take 1 tablet by mouth daily. 11/25/17  Yes [provider]  albuterol (PROVENTIL HFA;VENTOLIN HFA) 108 (90 Base) MCG/ACT inhaler Inhale 1-2 puffs into the lungs every 6 (six) hours as  needed for wheezing or shortness of breath. 01/12/18   Levy Cedano S, PA-C  benzonatate (TESSALON) 100 MG capsule Take 1 capsule (100 mg total) by mouth every 8 (eight) hours. 01/12/18   Abdishakur Gottschall S, PA-C  pantoprazole (PROTONIX) 40 MG tablet Take 1 tablet (40 mg total) by mouth daily. Patient not taking: Reported on 01/12/2018 10/22/17   Ward, Layla Maw, DO  sucralfate (CARAFATE) 1 GM/10ML suspension Take 10 mLs (1 g total) by mouth 4 (four) times daily -  with meals and at bedtime. Patient not taking: Reported on 01/12/2018 10/22/17   Ward, Layla Maw, DO    Family History No family history on file.  Social History Social History    Tobacco Use  . Smoking status: Never Smoker  . Smokeless tobacco: Never Used  Substance Use Topics  . Alcohol use: Yes    Frequency: Never    Comment: occ  . Drug use: Yes    Types: Marijuana     Allergies   Patient has no known allergies.   Review of Systems Review of Systems  Constitutional: Positive for chills and diaphoresis.  HENT: Positive for congestion. Negative for ear pain and sore throat.   Eyes: Negative for visual disturbance.  Respiratory: Positive for cough and shortness of breath. Negative for wheezing.   Cardiovascular:       Chest wall pain  Gastrointestinal: Positive for abdominal pain (RUQ), nausea (resolved) and vomiting (resolved). Negative for constipation.  Genitourinary: Negative for flank pain, frequency, hematuria and urgency.  Musculoskeletal: Negative for myalgias.  Skin: Negative for wound.  Neurological: Negative for dizziness, light-headedness and headaches.     Physical Exam Updated Vital Signs BP 130/86   Pulse 71   Temp 98.1 F (36.7 C) (Oral)   Resp 14   LMP 12/04/2017   SpO2 100%   Physical Exam  Constitutional: She appears well-developed and well-nourished. No distress.  Nontoxic-appearing  HENT:  Head: Normocephalic and atraumatic.  Bilateral TMs without erythema or effusion.  No pharyngeal erythema.  No tonsillar swelling or exudates.  Uvula midline.  Nose normal.  Moist mucous membranes.  Eyes: Pupils are equal, round, and reactive to light. Conjunctivae and EOM are normal.  Neck: Neck supple.  Cardiovascular: Normal rate and regular rhythm.  No murmur heard. Pulses:      Radial pulses are 2+ on the right side, and 2+ on the left side.       Dorsalis pedis pulses are 2+ on the right side, and 2+ on the left side.  Pulmonary/Chest: Effort normal and breath sounds normal. No respiratory distress. She has no decreased breath sounds. She has no wheezes. She has no rales.  Abdominal: Soft. Bowel sounds are normal. She  exhibits no distension. There is no tenderness. There is no guarding.  Musculoskeletal: She exhibits no edema.  No calf TTP, erythema, swelling.  Lymphadenopathy:    She has no cervical adenopathy.  Neurological: She is alert.  Skin: Skin is warm and dry.  Psychiatric: She has a normal mood and affect.  Nursing note and vitals reviewed.    ED Treatments / Results  Labs (all labs ordered are listed, but only abnormal results are displayed) Labs Reviewed  BASIC METABOLIC PANEL - Abnormal; Notable for the following components:      Result Value   Potassium 3.3 (*)    Calcium 8.3 (*)    All other components within normal limits  CBC - Abnormal; Notable for the following components:   RBC  3.71 (*)    Hemoglobin 10.4 (*)    HCT 32.4 (*)    All other components within normal limits  I-STAT TROPONIN, ED  I-STAT BETA HCG BLOOD, ED ([facility], [facility], [facility] ONLY)    EKG EKG Interpretation  Date/Time:  Wednesday January 12 2018 07:23:55 EDT Ventricular Rate:  63 PR Interval:  172 QRS Duration: 72 QT Interval:  426 QTC Calculation: 435 R Axis:   90 Text Interpretation:  Normal sinus rhythm with sinus arrhythmia Rightward axis Borderline ECG NO STEMI No old tracing to compare Confirmed by Drema Pryardama, Pedro [phone]([id number]) on 01/12/2018 9:01:11 AM   Radiology Dg Chest 2 View  Result Date: 01/12/2018 CLINICAL DATA:  Flu like symptoms for 2 weeks. EXAM: CHEST - 2 VIEW COMPARISON:  None. FINDINGS: The heart size and mediastinal contours are within normal limits. Both lungs are clear. The visualized skeletal structures are unremarkable. IMPRESSION: No active cardiopulmonary disease. Electronically Signed   By: Elige KoHetal  Patel   On: 01/12/2018 08:20    Procedures Procedures (including critical care time)  Medications Ordered in ED Medications  albuterol (PROVENTIL) (2.5 MG/3ML) 0.083% nebulizer solution 5 mg (5 mg Nebulization Given 01/12/18 0738)  albuterol (PROVENTIL) (2.5 MG/3ML) 0.083% nebulizer solution 2.5  mg (2.5 mg Nebulization Given 01/12/18 0919)  acetaminophen (TYLENOL) tablet 1,000 mg (1,000 mg Oral Given 01/12/18 0919)     Initial Impression / Assessment and Plan / ED Course  I have reviewed the triage vital signs and the nursing notes.  Pertinent labs & imaging results that were available during my care of the patient were reviewed by me and considered in my medical decision making (see chart for details).   Nursing staff ambulated patient and pt did not feel SOB. She maintained O2 sats to 99-100% on RA.   Final Clinical Impressions(s) / ED Diagnoses   Final diagnoses:  Mild asthma with exacerbation, unspecified whether persistent  Upper respiratory tract infection, unspecified type   Patient ambulated in ED with O2 saturations maintained >90, no current signs of respiratory distress. Lung exam was WNL after nebulizer treatment and pt felt improved. Pt states they are breathing at baseline. CXR was negative for pneumonia. ecg with NSR, no st elevations or depressions concerning for ischemia/infarct. No [id number]. Her reported h/o of abd pain and nausea/vomitnig are nonconcerning for emergent intraabdominal pathology as this time. She is currently asymptomatic. She has a h/o IBS as well as gastroparesis and states that she has been eating small meals recently and has not had any episodes of vomiting for almost one week. Her abd pain has previously been worked up and she had a negative workup at that time. Her sxs are at baseline today and her abdominal exam is benign. She was able to tolerate PO in the ED. She is nontoxic and nonspectic appearing with stable VS. She was slightly HTN which resolved during ED stay. doubt PE at this time as pts pain is located to entire chest wall and is reproducible on exam. She also reports complete resolution of sxs after 2 neb tx and tylenol. Labs are benign. Stable anemia with hgb 10.4. No leukocytosis. Mild hypokalemia to 3.3. Trop negative. Pregnancy test  negative.  Pt has been instructed to continue using prescribed medications and to speak with PCP about today's exacerbation. Return precautions discussed for any new or worsening sxs. All questions answered.    ED Discharge Orders        Ordered    benzonatate (TESSALON) 100 MG capsule  Every  8 hours     01/12/18 1010    albuterol (PROVENTIL HFA;VENTOLIN HFA) 108 (90 Base) MCG/ACT inhaler  Every 6 hours PRN     01/12/18 1010       Neesha Langton S, PA-C 01/12/18 1015    Nira Conn, MD 01/13/18 1125

## 2018-01-12 NOTE — Discharge Instructions (Addendum)
Your chest xray today did not show any evidence of pneumonia. Your labwork overall was normal although you did have a slightly low potassium. You will need to follow up with your doctor about this. Your troponin which is a cardiac enzyme was negative.   You should continue taking your normal asthma medications at home as well as the cough medicine that you have been prescribed for your cough. Please follow up with your primary care doctor within 1 week for re-evaluation. Return to the ED for any chest pain, shortness of breath, abdominal pain, or any new or worsening symptoms.

## 2018-01-12 NOTE — ED Notes (Signed)
Pt given gingerale for PO challenge 

## 2018-01-12 NOTE — ED Notes (Signed)
Pt states that she feels "much better" after her breathing treatment.

## 2018-01-12 NOTE — ED Notes (Signed)
Walked patient on pulse oxy patient did well stayed at 100 room air to 98 room air patient is back in bed with call bell in reach

## 2018-01-12 NOTE — ED Triage Notes (Signed)
Pt states 2 weeks of flu like symptoms including cold, cough body aches. Pt states this exacerbated her asthma, has been using nebs with no relief.  Pt states since Sunday she has had a constant pressure to her central chest, states she feels like something is sitting on her chest. 10/10. Pt requested breathing tx. @ triage

## 2018-05-25 ENCOUNTER — Encounter: Payer: Self-pay | Admitting: *Deleted

## 2018-05-25 ENCOUNTER — Emergency Department: Payer: BLUE CROSS/BLUE SHIELD

## 2018-05-25 ENCOUNTER — Other Ambulatory Visit: Payer: Self-pay

## 2018-05-25 ENCOUNTER — Emergency Department
Admission: EM | Admit: 2018-05-25 | Discharge: 2018-05-25 | Disposition: A | Discharge status: Home / Self Care | Payer: BLUE CROSS/BLUE SHIELD | Attending: Emergency Medicine | Admitting: Emergency Medicine

## 2018-05-25 DIAGNOSIS — J45909 Unspecified asthma, uncomplicated: Secondary | ICD-10-CM | POA: Diagnosis not present

## 2018-05-25 DIAGNOSIS — R748 Abnormal levels of other serum enzymes: Secondary | ICD-10-CM

## 2018-05-25 DIAGNOSIS — R0789 Other chest pain: Secondary | ICD-10-CM | POA: Insufficient documentation

## 2018-05-25 DIAGNOSIS — R1013 Epigastric pain: Secondary | ICD-10-CM | POA: Diagnosis not present

## 2018-05-25 DIAGNOSIS — R079 Chest pain, unspecified: Secondary | ICD-10-CM | POA: Diagnosis present

## 2018-05-25 DIAGNOSIS — Z79899 Other long term (current) drug therapy: Secondary | ICD-10-CM | POA: Diagnosis not present

## 2018-05-25 DIAGNOSIS — K3184 Gastroparesis: Secondary | ICD-10-CM | POA: Insufficient documentation

## 2018-05-25 DIAGNOSIS — R7989 Other specified abnormal findings of blood chemistry: Secondary | ICD-10-CM | POA: Insufficient documentation

## 2018-05-25 DIAGNOSIS — R1011 Right upper quadrant pain: Secondary | ICD-10-CM | POA: Diagnosis not present

## 2018-05-25 LAB — TROPONIN I

## 2018-05-25 LAB — CBC
HCT: 35.1 % (ref 35.0–47.0)
Hemoglobin: 12 g/dL (ref 12.0–16.0)
MCH: 29.9 pg (ref 26.0–34.0)
MCHC: 34.2 g/dL (ref 32.0–36.0)
MCV: 87.4 fL (ref 80.0–100.0)
PLATELETS: 286 10*3/uL (ref 150–440)
RBC: 4.02 MIL/uL (ref 3.80–5.20)
RDW: 12.8 % (ref 11.5–14.5)
WBC: 5.1 10*3/uL (ref 3.6–11.0)

## 2018-05-25 LAB — BASIC METABOLIC PANEL
ANION GAP: 9 (ref 5–15)
BUN: 16 mg/dL (ref 6–20)
CALCIUM: 8.7 mg/dL — AB (ref 8.9–10.3)
CO2: 24 mmol/L (ref 22–32)
CREATININE: 0.68 mg/dL (ref 0.44–1.00)
Chloride: 104 mmol/L (ref 98–111)
GFR calc non Af Amer: >60 mL/min (ref >60)
GLUCOSE: 85 mg/dL (ref 70–99)
Potassium: 3.8 mmol/L (ref 3.5–5.1)
Sodium: 137 mmol/L (ref 135–145)

## 2018-05-25 LAB — LIPASE, BLOOD: LIPASE: 70 U/L — AB (ref 11–51)

## 2018-05-25 LAB — HEPATIC FUNCTION PANEL
ALT: 10 U/L (ref 0–44)
AST: 14 U/L — ABNORMAL LOW (ref 15–41)
Albumin: 3.8 g/dL (ref 3.5–5.0)
Alkaline Phosphatase: 57 U/L (ref 38–126)
BILIRUBIN TOTAL: 0.5 mg/dL (ref 0.3–1.2)
Bilirubin, Direct: 0.1 mg/dL (ref 0.0–0.2)
Indirect Bilirubin: 0.4 mg/dL (ref 0.3–0.9)
Total Protein: 8 g/dL (ref 6.5–8.1)

## 2018-05-25 LAB — POC URINE PREG, ED: PREG TEST UR: NEGATIVE

## 2018-05-25 MED ORDER — ONDANSETRON 4 MG PO TBDP: ORAL_TABLET | ORAL | 0 refills | Status: AC

## 2018-05-25 NOTE — ED Triage Notes (Addendum)
Pt to ED from work reporting centralized chest pain that is radiating to her left breast. Pt reports the pain started the same as her heart burn but has persisted longer and worsened. Pain is constant but shooting pains intermittently throughout the day. No other symptoms reported. No cardiac hx.   [facility] and told her had elevated BP that the OBGYN was going to monitor for the next 4 weeks. No hx of HTN and no medications reported.

## 2018-05-25 NOTE — ED Provider Notes (Signed)
[facility] Department Provider Note  ____________________________________________   None    (approximate)  I have reviewed the triage vital signs and the nursing notes.   HISTORY  Chief Complaint Chest Pain    HPI Virginia [name] is a 37 y.o. female with medical history as listed below who presents for evaluation of acute onset pain in the central lower part of her chest that radiates to the left side of her chest.  It started today but she has felt similar symptoms, just more mild, in the past.  She describes this as at least moderate in severity.  She says it is not burning and does not feel like acid reflux, although originally that so she thought it was.  After we talk some more she told me that she has felt the symptoms repeatedly in the past and they seem to be worse after she eats.  Nothing particular makes it better.  She denies fever/chills, nausea, vomiting, shortness of breath, lower abdominal pain, dysuria.  She has no history of tobacco use.  She has been monitored for hypertension but is on no medications.  No diabetes.  No first-degree relatives with heart attacks.  No history of blood clots in the legs of the lungs.  She does take a birth control pill, Vienva, ethinylestradiol/levonorgestrel.  No long trips or immobilizations or surgeries recently.  She says that she had some similar symptoms about 7 or 8 months ago and was seen at [facility].  She had a CT scan of her abdomen and pelvis at that time and did not demonstrate any abnormalities.  Past Medical History:  Diagnosis Date  . Anxiety   . Asthma   . Depression   . Gastroparesis   . Hernia of abdominal wall   . IBS (irritable bowel syndrome)     There are no active problems to display for this patient.   History reviewed. No pertinent surgical history.  Prior to Admission medications   Medication Sig Start Date End Date Taking? Authorizing Provider  albuterol (PROVENTIL  HFA;VENTOLIN HFA) 108 (90 Base) MCG/ACT inhaler Inhale 1-2 puffs into the lungs every 6 (six) hours as needed for wheezing or shortness of breath. 01/12/18   Couture, Cortni S, PA-C  benzonatate (TESSALON) 100 MG capsule Take 1 capsule (100 mg total) by mouth every 8 (eight) hours. 01/12/18   Couture, Cortni S, PA-C  bismuth subsalicylate (PEPTO BISMOL) 262 MG chewable tablet Chew 524 mg by mouth as needed for indigestion.    [provider]  chlorpheniramine (CHLOR-TRIMETON) 4 MG tablet Take 4 mg by mouth as needed for allergies.    [provider]  FEROSUL 325 (65 Fe) MG tablet Take 325 mg by mouth daily. 11/22/17   [provider]  ondansetron (ZOFRAN ODT) 4 MG disintegrating tablet Allow 1-2 tablets to dissolve in your mouth every 8 hours as needed for nausea/vomiting 05/25/18   Loleta Rose, MD  ondansetron (ZOFRAN) 4 MG tablet Take 2 tablets (8 mg total) by mouth every 4 (four) hours as needed for nausea or vomiting. 12/31/17   Isa Rankin, MD  pantoprazole (PROTONIX) 40 MG tablet Take 1 tablet (40 mg total) by mouth daily. Patient not taking: Reported on 01/12/2018 10/22/17   Ward, Layla Maw, DO  sucralfate (CARAFATE) 1 GM/10ML suspension Take 10 mLs (1 g total) by mouth 4 (four) times daily -  with meals and at bedtime. Patient not taking: Reported on 01/12/2018 10/22/17   Ward, Baxter Hire  N, DO  VIENVA 0.1-20 MG-MCG tablet Take 1 tablet by mouth daily. 11/25/17   [provider]    Allergies Patient has no known allergies.  History reviewed. No pertinent family history.  Social History Social History   Tobacco Use  . Smoking status: Never Smoker  . Smokeless tobacco: Never Used  Substance Use Topics  . Alcohol use: Yes    Frequency: Never    Comment: occ  . Drug use: Yes    Types: Marijuana    Review of Systems Constitutional: No fever/chills Eyes: No visual changes. ENT: No sore throat. Cardiovascular: chest pain as described  above Respiratory: Denies shortness of breath. Gastrointestinal: No abdominal pain.  No nausea, no vomiting.  No diarrhea.  No constipation. Genitourinary: Negative for dysuria. Musculoskeletal: Negative for neck pain.  Negative for back pain. Integumentary: Negative for rash. Neurological: Negative for headaches, focal weakness or numbness.   ____________________________________________   PHYSICAL EXAM:  VITAL SIGNS: ED Triage Vitals  Enc Vitals Group     BP 05/25/18 1304 (!) 153/87     Pulse Rate 05/25/18 1304 62     Resp 05/25/18 1304 16     Temp 05/25/18 1304 98.4 F (36.9 C)     Temp Source 05/25/18 1304 Oral     SpO2 05/25/18 1304 100 %     Weight 05/25/18 1305 74.4 kg (164 lb)     Height 05/25/18 1305 1.575 m (5\' 2" )     Head Circumference --      Peak Flow --      Pain Score 05/25/18 1304 8     Pain Loc --      Pain Edu? --      Excl. in GC? --     Constitutional: Alert and oriented. Well appearing and in no acute distress. Eyes: Conjunctivae are normal.  Head: Atraumatic. Nose: No congestion/rhinnorhea. Mouth/Throat: Mucous membranes are moist. Neck: No stridor.  No meningeal signs.   Cardiovascular: Normal rate, regular rhythm. Good peripheral circulation. Grossly normal heart sounds.  No chest wall tenderness to palpation Respiratory: Normal respiratory effort.  No retractions. Lungs CTAB. Gastrointestinal: Soft and nondistended.  Tender to palpation of the epigastrium and right upper quadrant with equivocally positive Murphy sign.  No tenderness to palpation of the left upper quadrant or lower abdomen.  No rebound and no guarding. Musculoskeletal: No lower extremity tenderness nor edema. No gross deformities of extremities. Neurologic:  Normal speech and language. No gross focal neurologic deficits are appreciated.  Skin:  Skin is warm, dry and intact. No rash noted. Psychiatric: Mood and affect are normal. Speech and behavior are  normal.  ____________________________________________   LABS (all labs ordered are listed, but only abnormal results are displayed)  Labs Reviewed  BASIC METABOLIC PANEL - Abnormal; Notable for the following components:      Result Value   Calcium 8.7 (*)    All other components within normal limits  HEPATIC FUNCTION PANEL - Abnormal; Notable for the following components:   AST 14 (*)    All other components within normal limits  LIPASE, BLOOD - Abnormal; Notable for the following components:   Lipase 70 (*)    All other components within normal limits  CBC  TROPONIN I  POC URINE PREG, ED   ____________________________________________  EKG  ED ECG REPORT I, Loleta Roseory Rosalva Neary, the attending physician, personally viewed and interpreted this ECG.  Date: 05/25/2018 EKG Time: 13: 01 Rate: 59 Rhythm: Borderline sinus bradycardia QRS Axis: Right  axis deviation Intervals: normal, low voltage QRS ST/T Wave abnormalities: normal Narrative Interpretation: no evidence of acute ischemia  ____________________________________________  RADIOLOGY I, Loleta Rose, personally viewed and evaluated these images (plain radiographs) as part of my medical decision making, as well as reviewing the written report by the radiologist.  ED MD interpretation: No acute abnormalities identified on chest x-ray  Official radiology report(s): Dg Chest 2 View  Result Date: 05/25/2018 CLINICAL DATA:  Mid chest pain radiating to the left breast intermittent sharp pains superimposed upon chronic pain EXAM: CHEST - 2 VIEW COMPARISON:  Chest x-ray of January 12, 2018 FINDINGS: The lungs are well-expanded. There is no focal infiltrate. There is no pleural effusion. The heart and pulmonary vascularity are normal. The mediastinum is normal in width. The bony thorax is unremarkable. IMPRESSION: There is no active cardiopulmonary disease. Electronically Signed   By: David  [place] M.D.   On: 05/25/2018 13:36   US  Abdomen Limited Ruq  Result Date: 05/25/2018 CLINICAL DATA:  Right upper quadrant pain for 8 months. Chest pain for 1 day. EXAM: ULTRASOUND ABDOMEN LIMITED RIGHT UPPER QUADRANT COMPARISON:  CT abdomen and pelvis and right upper quadrant abdominal ultrasound 10/22/2017. FINDINGS: Gallbladder: No gallstones or wall thickening visualized. No sonographic Murphy sign noted by sonographer. Common bile duct: Diameter: 0.3 cm Liver: No focal lesion identified. Within normal limits in parenchymal echogenicity. Portal vein is patent on color Doppler imaging with normal direction of blood flow towards the liver. IMPRESSION: Negative for gallstones.  Normal exam. Electronically Signed   By: Drusilla Kanner M.D.   On: 05/25/2018 15:38    ____________________________________________   PROCEDURES  Critical Care performed: No   Procedure(s) performed:   Procedures   ____________________________________________   INITIAL IMPRESSION / ASSESSMENT AND PLAN / ED COURSE  As part of my medical decision making, I reviewed the following data within the electronic MEDICAL RECORD NUMBER Nursing notes reviewed and incorporated, Labs reviewed , EKG interpreted , Old chart reviewed and Notes from prior ED visits    Differential diagnosis includes, but is not limited to, musculoskeletal chest pain, PE, ACS, pneumonia, biliary colic.  The patient is low risk for ACS based on HEART score, and she would be PERC negative except for the use of the birth control pill.  However, her Wells score for PE is 0, she has no hypoxemia, no tachycardia, and no history of shortness of breath.  I do not feel she would benefit from a d-dimer or further evaluation for pulmonary embolism at this time as it is more likely based on her very low pretest probability that the d-dimer will be falsely positive for PE and it would be for her to have a pulmonary embolism based solely upon the use of the birth control pill.  The abnormality on her  physical exam that is notable is the tenderness to palpation of the epigastrium and right upper quadrant.  She winced and reported pain and tenderness when I palpated the gallbladder.  It is possible that she is having some atypical pain due to biliary colic which would explain a great deal about the history she is reporting.  I checked the medical record and read about her ED visits including the visit back in January when she had a CT scan.  However, gallstones would likely not have shown up at that time they could have gotten significantly bigger in the last 7 months and now be causing the symptoms that she has been expensing recently.  I  discussed all this with her including the rest of the reassuring results.  I have added on hepatic function panel and lipase to the rest of her blood work that was all within normal limits including her basic metabolic panel, troponin, and CBC.  Chest x-ray shows no acute abnormalities and EKG shows no sign of ischemia.  I will reassess after the hepatic function panel, lipase, and ultrasound results are back, and she agrees with the plan.  Clinical Course as of May 25 1618  Wed May 25, 2018  1616 Reassuring LFTs  Hepatic function panel(!) [CF]  1616 Mildly elevated lipase  Lipase(!): 70 [CF]  1616 The patient's work-up is reassuring except for slightly elevated lipase.  This certainly could could account for the epigastric tenderness as well as potentially the other symptoms as well.  However her vitals are stable, she is afebrile, no tachycardia, has only mild tenderness to palpation, and is having no difficulty with nausea or vomiting.  We went over history again and she has only the rare alcoholic drink, and has started no new medications recently.She is comfortable with the plan to go home and in fact wants to go back to work tomorrow.  I will give her information about pancreatitis including an eating plan that should help and I gave her strict return precautions  come back should she get worse.  She understands and agrees with the plan.   [CF]    Clinical Course User Index [CF] Loleta Rose, MD    ____________________________________________  FINAL CLINICAL IMPRESSION(S) / ED DIAGNOSES  Final diagnoses:  Atypical chest pain  Epigastric pain  Elevated lipase     MEDICATIONS GIVEN DURING THIS VISIT:  Medications - No data to display   ED Discharge Orders         Ordered    ondansetron (ZOFRAN ODT) 4 MG disintegrating tablet     05/25/18 1619           Note:  This document was prepared using Dragon voice recognition software and may include unintentional dictation errors.    Loleta Rose, MD 05/25/18 1620

## 2018-05-25 NOTE — Discharge Instructions (Signed)
As we discussed, your work-up was reassuring in general.  Your lipase level is slightly elevated which could indicate a mild pancreatitis.  Please read through the included information about pancreatitis and the gallbladder/pancreatitis eating plan.  If your symptoms get worse or you develop any new symptoms that concern you, please return to the emergency department.

## 2018-05-25 NOTE — ED Notes (Signed)
Lab called and informed to add on blood work orders.  Lab verbalized understanding of this information.

## 2018-05-25 NOTE — ED Notes (Signed)
Pt in radiology 

## 2021-02-10 ENCOUNTER — Other Ambulatory Visit: Payer: Self-pay | Admitting: Obstetrics and Gynecology

## 2021-02-10 DIAGNOSIS — N6489 Other specified disorders of breast: Secondary | ICD-10-CM

## 2021-03-05 ENCOUNTER — Ambulatory Visit
Admission: RE | Admit: 2021-03-05 | Discharge: 2021-03-05 | Disposition: A | Discharge status: Home / Self Care | Payer: BLUE CROSS/BLUE SHIELD | Source: Ambulatory Visit | Attending: Obstetrics and Gynecology | Admitting: Obstetrics and Gynecology

## 2021-03-05 ENCOUNTER — Other Ambulatory Visit: Payer: BLUE CROSS/BLUE SHIELD

## 2021-03-05 ENCOUNTER — Ambulatory Visit
Admission: RE | Admit: 2021-03-05 | Discharge: 2021-03-05 | Disposition: A | Discharge status: Home / Self Care | Payer: BC Managed Care – PPO | Source: Ambulatory Visit | Attending: Obstetrics and Gynecology | Admitting: Obstetrics and Gynecology

## 2021-03-05 ENCOUNTER — Other Ambulatory Visit: Payer: Self-pay

## 2021-03-05 DIAGNOSIS — N6489 Other specified disorders of breast: Secondary | ICD-10-CM

## 2021-10-03 HISTORY — PX: COLONOSCOPY WITH ESOPHAGOGASTRODUODENOSCOPY (EGD): SHX5779

## 2022-02-09 ENCOUNTER — Other Ambulatory Visit: Payer: Self-pay

## 2022-02-09 ENCOUNTER — Emergency Department (HOSPITAL_BASED_OUTPATIENT_CLINIC_OR_DEPARTMENT_OTHER): Payer: BC Managed Care – PPO

## 2022-02-09 ENCOUNTER — Emergency Department (HOSPITAL_BASED_OUTPATIENT_CLINIC_OR_DEPARTMENT_OTHER)
Admission: EM | Admit: 2022-02-09 | Discharge: 2022-02-09 | Disposition: A | Discharge status: Home / Self Care | Payer: BC Managed Care – PPO | Attending: Emergency Medicine | Admitting: Emergency Medicine

## 2022-02-09 ENCOUNTER — Encounter (HOSPITAL_BASED_OUTPATIENT_CLINIC_OR_DEPARTMENT_OTHER): Payer: Self-pay | Admitting: Emergency Medicine

## 2022-02-09 DIAGNOSIS — J069 Acute upper respiratory infection, unspecified: Secondary | ICD-10-CM | POA: Diagnosis not present

## 2022-02-09 DIAGNOSIS — Z20822 Contact with and (suspected) exposure to covid-19: Secondary | ICD-10-CM | POA: Diagnosis not present

## 2022-02-09 DIAGNOSIS — R059 Cough, unspecified: Secondary | ICD-10-CM | POA: Diagnosis present

## 2022-02-09 LAB — CBC WITH DIFFERENTIAL/PLATELET
Abs Immature Granulocytes: 0.02 10*3/uL (ref 0.00–0.07)
Basophils Absolute: 0 10*3/uL (ref 0.0–0.1)
Basophils Relative: 0 %
Eosinophils Absolute: 0.1 10*3/uL (ref 0.0–0.5)
Eosinophils Relative: 1 %
HCT: 35.2 % — ABNORMAL LOW (ref 36.0–46.0)
Hemoglobin: 11.4 g/dL — ABNORMAL LOW (ref 12.0–15.0)
Immature Granulocytes: 0 %
Lymphocytes Relative: 36 %
Lymphs Abs: 2.6 10*3/uL (ref 0.7–4.0)
MCH: 28.1 pg (ref 26.0–34.0)
MCHC: 32.4 g/dL (ref 30.0–36.0)
MCV: 86.9 fL (ref 80.0–100.0)
Monocytes Absolute: 0.4 10*3/uL (ref 0.1–1.0)
Monocytes Relative: 6 %
Neutro Abs: 4.1 10*3/uL (ref 1.7–7.7)
Neutrophils Relative %: 57 %
Platelets: 287 10*3/uL (ref 150–400)
RBC: 4.05 MIL/uL (ref 3.87–5.11)
RDW: 12.9 % (ref 11.5–15.5)
WBC: 7.2 10*3/uL (ref 4.0–10.5)
nRBC: 0 % (ref 0.0–0.2)

## 2022-02-09 LAB — BASIC METABOLIC PANEL
Anion gap: 6 (ref 5–15)
BUN: 16 mg/dL (ref 6–20)
CO2: 23 mmol/L (ref 22–32)
Calcium: 8.6 mg/dL — ABNORMAL LOW (ref 8.9–10.3)
Chloride: 105 mmol/L (ref 98–111)
Creatinine, Ser: 0.83 mg/dL (ref 0.44–1.00)
GFR, Estimated: >60 mL/min (ref >60)
Glucose, Bld: 90 mg/dL (ref 70–99)
Potassium: 3.4 mmol/L — ABNORMAL LOW (ref 3.5–5.1)
Sodium: 134 mmol/L — ABNORMAL LOW (ref 135–145)

## 2022-02-09 LAB — RESP PANEL BY RT-PCR (FLU A&B, COVID) ARPGX2
Influenza A by PCR: NEGATIVE
Influenza B by PCR: NEGATIVE
SARS Coronavirus 2 by RT PCR: NEGATIVE

## 2022-02-09 LAB — D-DIMER, QUANTITATIVE: D-Dimer, Quant: >20 ug/mL-FEU — ABNORMAL HIGH (ref 0.00–0.50)

## 2022-02-09 LAB — TROPONIN I (HIGH SENSITIVITY): Troponin I (High Sensitivity): <2 ng/L (ref <18)

## 2022-02-09 MED ORDER — HYDROCOD POLI-CHLORPHE POLI ER 10-8 MG/5ML PO SUER
5.0000 mL | Freq: Once | ORAL | Status: AC
  Administered 2022-02-09: 5 mL via ORAL
  Filled 2022-02-09: qty 5

## 2022-02-09 MED ORDER — HYDROCOD POLI-CHLORPHE POLI ER 10-8 MG/5ML PO SUER: 5.0000 mL | Freq: Two times a day (BID) | ORAL | 0 refills | Status: DC | PRN

## 2022-02-09 MED ORDER — ONDANSETRON HCL 4 MG/2ML IJ SOLN
4.0000 mg | Freq: Once | INTRAMUSCULAR | Status: AC
  Administered 2022-02-09: 4 mg via INTRAVENOUS
  Filled 2022-02-09: qty 2

## 2022-02-09 MED ORDER — IOHEXOL 350 MG/ML SOLN
80.0000 mL | Freq: Once | INTRAVENOUS | Status: AC | PRN
  Administered 2022-02-09: 80 mL via INTRAVENOUS

## 2022-02-09 NOTE — ED Triage Notes (Signed)
Cough x 2 weeks, starts as a dry cough and then will cough up mucus. Pt states she has coughed so much her back and chest hurt sometimes it makes her feel nauseated. Albuterol inhaler and nebulizer not providing any relief. Denies any fevers.  ?

## 2022-02-09 NOTE — ED Notes (Signed)
Pt NAD in bed, coughing. A/ox4, c/o semi productive clear mucous cough x 2 weeks. HX asthma, has been using inhaler every day x 1 week. LS clear bilaterally.  ?

## 2022-02-09 NOTE — Discharge Instructions (Addendum)
Follow-up with your primary care doctor.  Return to ER if you develop worsening difficulty breathing, any chest pain or other new concerning symptom. ?

## 2022-02-09 NOTE — ED Provider Notes (Signed)
?[facility] EMERGENCY DEPARTMENT ?Provider Note ? ? ?CSN: [id number] ?Arrival date & time: 02/09/22  1652 ? ?  ? ?History ? ?Chief Complaint  ?Patient presents with  ? Cough  ? ? ?Virginia [name] is a 41 y.o. female.  Presented to the emergency room due to concern for cough, chest pain, shortness of breath.  Patient reports cough ongoing for about 2 weeks.  Mostly nonproductive but has had some clear sputum production.  A couple weeks ago she did noticed small specks of blood in her cough as well.  None recently.  Over the last few days she has had worsening shortness of breath, at times feels like she cannot catch her breath but does not feel short of breath right now.  Also having some chest discomfort, chest discomfort is worse with frequent coughing spells.  Has tried albuterol treatments at home without significant relief.  Has seen PCP.  Taking some sort of cough medicine from PCP but this is not helping. ? ?HPI ? ?  ? ?Home Medications ?Prior to Admission medications   ?Medication Sig Start Date End Date Taking? Authorizing Provider  ?albuterol (PROVENTIL HFA;VENTOLIN HFA) 108 (90 Base) MCG/ACT inhaler Inhale 1-2 puffs into the lungs every 6 (six) hours as needed for wheezing or shortness of breath. 01/12/18   Couture, Cortni S, PA-C  ?benzonatate (TESSALON) 100 MG capsule Take 1 capsule (100 mg total) by mouth every 8 (eight) hours. 01/12/18   Couture, Cortni S, PA-C  ?bismuth subsalicylate (PEPTO BISMOL) 262 MG chewable tablet Chew 524 mg by mouth as needed for indigestion.    [provider]  ?chlorpheniramine (CHLOR-TRIMETON) 4 MG tablet Take 4 mg by mouth as needed for allergies.    [provider]  ?FEROSUL 325 (65 Fe) MG tablet Take 325 mg by mouth daily. 11/22/17   [provider]  ?ondansetron (ZOFRAN ODT) 4 MG disintegrating tablet Allow 1-2 tablets to dissolve in your mouth every 8 hours as needed for nausea/vomiting 05/25/18   Loleta RoseForbach, Cory, MD  ?ondansetron (ZOFRAN) 4  MG tablet Take 2 tablets (8 mg total) by mouth every 4 (four) hours as needed for nausea or vomiting. 12/31/17   Isa RankinMurray, Laura Wilson, MD  ?pantoprazole (PROTONIX) 40 MG tablet Take 1 tablet (40 mg total) by mouth daily. ?Patient not taking: Reported on 01/12/2018 10/22/17   Ward, Layla MawKristen N, DO  ?sucralfate (CARAFATE) 1 GM/10ML suspension Take 10 mLs (1 g total) by mouth 4 (four) times daily -  with meals and at bedtime. ?Patient not taking: Reported on 01/12/2018 10/22/17   Ward, Layla MawKristen N, DO  ?VIENVA 0.1-20 MG-MCG tablet Take 1 tablet by mouth daily. 11/25/17   [provider]  ?   ? ?Allergies    ?Patient has no known allergies.   ? ?Review of Systems   ?Review of Systems  ?Constitutional:  Positive for fatigue. Negative for chills and fever.  ?HENT:  Negative for ear pain and sore throat.   ?Eyes:  Negative for pain and visual disturbance.  ?Respiratory:  Positive for cough and shortness of breath.   ?Cardiovascular:  Positive for chest pain. Negative for palpitations.  ?Gastrointestinal:  Negative for abdominal pain and vomiting.  ?Genitourinary:  Negative for dysuria and hematuria.  ?Musculoskeletal:  Negative for arthralgias and back pain.  ?Skin:  Negative for color change and rash.  ?Neurological:  Negative for seizures and syncope.  ?All other systems reviewed and are negative. ? ?Physical Exam ?Updated Vital Signs ?BP (!) 135/92  Pulse 77   Temp 98.7 ?F (37.1 ?C) (Oral)   Resp 19   Ht 5\' 2"  (1.575 m)   Wt 88 kg   LMP 01/28/2022 (Exact Date)   SpO2 99%   BMI 35.48 kg/m?  ?Physical Exam ?Vitals and nursing note reviewed.  ?Constitutional:   ?   General: She is not in acute distress. ?   Appearance: She is well-developed.  ?HENT:  ?   Head: Normocephalic and atraumatic.  ?Eyes:  ?   Conjunctiva/sclera: Conjunctivae normal.  ?Cardiovascular:  ?   Rate and Rhythm: Normal rate and regular rhythm.  ?   Heart sounds: No murmur heard. ?Pulmonary:  ?   Effort: Pulmonary effort is normal. No respiratory  distress.  ?   Breath sounds: Normal breath sounds.  ?Abdominal:  ?   Palpations: Abdomen is soft.  ?   Tenderness: There is no abdominal tenderness.  ?Musculoskeletal:     ?   General: No swelling.  ?   Cervical back: Neck supple.  ?Skin: ?   General: Skin is warm and dry.  ?   Capillary Refill: Capillary refill takes less than 2 seconds.  ?Neurological:  ?   General: No focal deficit present.  ?   Mental Status: She is alert.  ?Psychiatric:     ?   Mood and Affect: Mood normal.  ? ? ?ED Results / Procedures / Treatments   ?Labs ?(all labs ordered are listed, but only abnormal results are displayed) ?Labs Reviewed  ?CBC WITH DIFFERENTIAL/PLATELET - Abnormal; Notable for the following components:  ?    Result Value  ? Hemoglobin 11.4 (*)   ? HCT 35.2 (*)   ? All other components within normal limits  ?BASIC METABOLIC PANEL - Abnormal; Notable for the following components:  ? Sodium 134 (*)   ? Potassium 3.4 (*)   ? Calcium 8.6 (*)   ? All other components within normal limits  ?D-DIMER, QUANTITATIVE - Abnormal; Notable for the following components:  ? D-Dimer, Quant >20.00 (*)   ? All other components within normal limits  ?RESP PANEL BY RT-PCR (FLU A&B, COVID) [id number]  ?TROPONIN I (HIGH SENSITIVITY)  ? ? ?EKG ?EKG Interpretation ? ?Date/Time:  Monday Feb 09 2022 18:32:33 EDT ?Ventricular Rate:  90 ?PR Interval:  168 ?QRS Duration: 88 ?QT Interval:  387 ?QTC Calculation: 474 ?R Axis:   92 ?Text Interpretation: Sinus rhythm Borderline right axis deviation Low voltage, precordial leads Borderline T wave abnormalities Confirmed by 01-02-2001 (Marianna Fuss) on 02/09/2022 7:10:05 PM ? ?Radiology ?DG Chest 2 View ? ?Result Date: 02/09/2022 ?CLINICAL DATA:  Chest pain. EXAM: CHEST - 2 VIEW COMPARISON:  May 25, 2018. FINDINGS: The heart size and mediastinal contours are within normal limits. Both lungs are clear. The visualized skeletal structures are unremarkable. IMPRESSION: No active cardiopulmonary disease.  Electronically Signed   By: May 27, 2018 M.D.   On: 02/09/2022 18:05  ? ?CT Angio Chest PE W and/or Wo Contrast ? ?Result Date: 02/09/2022 ?CLINICAL DATA:  Cough for 2 weeks EXAM: CT ANGIOGRAPHY CHEST WITH CONTRAST TECHNIQUE: Multidetector CT imaging of the chest was performed using the standard protocol during bolus administration of intravenous contrast. Multiplanar CT image reconstructions and MIPs were obtained to evaluate the vascular anatomy. RADIATION DOSE REDUCTION: This exam was performed according to the departmental dose-optimization program which includes automated exposure control, adjustment of the mA and/or kV according to patient size and/or use of iterative reconstruction technique. CONTRAST:  21mL OMNIPAQUE IOHEXOL  350 MG/ML SOLN COMPARISON:  02/09/2022 FINDINGS: Cardiovascular: This is a technically adequate evaluation of the pulmonary vasculature. No filling defects or pulmonary emboli. The heart is unremarkable without pericardial effusion. No evidence of thoracic aortic aneurysm or dissection. Mediastinum/Nodes: No enlarged mediastinal, hilar, or axillary lymph nodes. Thyroid gland, trachea, and esophagus demonstrate no significant findings. Lungs/Pleura: No acute airspace disease, effusion, or pneumothorax. Central airways are widely patent. Upper Abdomen: No acute abnormality. Musculoskeletal: No acute or destructive bony lesions. Reconstructed images demonstrate no additional findings. Review of the MIP images confirms the above findings. IMPRESSION: 1. No evidence of pulmonary embolus. 2. No acute intrathoracic process. Electronically Signed   By: Sharlet Salina M.D.   On: 02/09/2022 20:34   ? ?Procedures ?Procedures  ? ? ?Medications Ordered in ED ?Medications  ?chlorpheniramine-HYDROcodone 10-8 MG/5ML suspension 5 mL (5 mLs Oral Given 02/09/22 1827)  ?iohexol (OMNIPAQUE) 350 MG/ML injection 80 mL (80 mLs Intravenous Contrast Given 02/09/22 2003)  ? ? ?ED Course/ Medical Decision Making/  A&P ?  ?                        ?Medical Decision Making ?Amount and/or Complexity of Data Reviewed ?Labs: ordered. ?Radiology: ordered. ? ?Risk ?Prescription drug management. ? ? ?41 year old lady presented to the emerge

## 2022-02-09 NOTE — ED Notes (Signed)
Pt NAD, a/ox4. Pt verbalizes understanding of all DC and f/u instructions. All questions answered. Pt walks with steady gait to lobby at DC.  ? ?

## 2022-02-18 ENCOUNTER — Other Ambulatory Visit: Payer: Self-pay | Admitting: Obstetrics and Gynecology

## 2022-02-18 DIAGNOSIS — R928 Other abnormal and inconclusive findings on diagnostic imaging of breast: Secondary | ICD-10-CM

## 2022-03-11 ENCOUNTER — Ambulatory Visit
Admission: RE | Admit: 2022-03-11 | Discharge: 2022-03-11 | Disposition: A | Discharge status: Home / Self Care | Payer: BC Managed Care – PPO | Source: Ambulatory Visit | Attending: Obstetrics and Gynecology | Admitting: Obstetrics and Gynecology

## 2022-03-11 DIAGNOSIS — R928 Other abnormal and inconclusive findings on diagnostic imaging of breast: Secondary | ICD-10-CM

## 2022-06-09 ENCOUNTER — Emergency Department (HOSPITAL_BASED_OUTPATIENT_CLINIC_OR_DEPARTMENT_OTHER): Payer: BC Managed Care – PPO

## 2022-06-09 ENCOUNTER — Emergency Department (HOSPITAL_BASED_OUTPATIENT_CLINIC_OR_DEPARTMENT_OTHER)
Admission: EM | Admit: 2022-06-09 | Discharge: 2022-06-09 | Disposition: A | Discharge status: Home / Self Care | Payer: BC Managed Care – PPO | Attending: Emergency Medicine | Admitting: Emergency Medicine

## 2022-06-09 ENCOUNTER — Encounter (HOSPITAL_BASED_OUTPATIENT_CLINIC_OR_DEPARTMENT_OTHER): Payer: Self-pay

## 2022-06-09 ENCOUNTER — Other Ambulatory Visit: Payer: Self-pay

## 2022-06-09 DIAGNOSIS — I10 Essential (primary) hypertension: Secondary | ICD-10-CM | POA: Insufficient documentation

## 2022-06-09 DIAGNOSIS — R42 Dizziness and giddiness: Secondary | ICD-10-CM | POA: Diagnosis not present

## 2022-06-09 DIAGNOSIS — R0789 Other chest pain: Secondary | ICD-10-CM | POA: Insufficient documentation

## 2022-06-09 DIAGNOSIS — R002 Palpitations: Secondary | ICD-10-CM | POA: Diagnosis present

## 2022-06-09 HISTORY — DX: Palpitations: R00.2

## 2022-06-09 LAB — BASIC METABOLIC PANEL
Anion gap: 10 (ref 5–15)
BUN: 14 mg/dL (ref 6–20)
CO2: 25 mmol/L (ref 22–32)
Calcium: 8.7 mg/dL — ABNORMAL LOW (ref 8.9–10.3)
Chloride: 97 mmol/L — ABNORMAL LOW (ref 98–111)
Creatinine, Ser: 0.97 mg/dL (ref 0.44–1.00)
GFR, Estimated: >60 mL/min (ref >60)
Glucose, Bld: 91 mg/dL (ref 70–99)
Potassium: 3.2 mmol/L — ABNORMAL LOW (ref 3.5–5.1)
Sodium: 132 mmol/L — ABNORMAL LOW (ref 135–145)

## 2022-06-09 LAB — CBC
HCT: 36.6 % (ref 36.0–46.0)
Hemoglobin: 12.1 g/dL (ref 12.0–15.0)
MCH: 28.3 pg (ref 26.0–34.0)
MCHC: 33.1 g/dL (ref 30.0–36.0)
MCV: 85.5 fL (ref 80.0–100.0)
Platelets: 301 10*3/uL (ref 150–400)
RBC: 4.28 MIL/uL (ref 3.87–5.11)
RDW: 12.6 % (ref 11.5–15.5)
WBC: 7.8 10*3/uL (ref 4.0–10.5)
nRBC: 0 % (ref 0.0–0.2)

## 2022-06-09 LAB — TROPONIN I (HIGH SENSITIVITY)
Troponin I (High Sensitivity): <2 ng/L (ref <18)
Troponin I (High Sensitivity): <2 ng/L (ref <18)

## 2022-06-09 LAB — PREGNANCY, URINE: Preg Test, Ur: NEGATIVE

## 2022-06-09 LAB — CBG MONITORING, ED: Glucose-Capillary: 96 mg/dL (ref 70–99)

## 2022-06-09 MED ORDER — SODIUM CHLORIDE 0.9 % IV BOLUS
1000.0000 mL | Freq: Once | INTRAVENOUS | Status: AC
  Administered 2022-06-09: 1000 mL via INTRAVENOUS

## 2022-06-09 NOTE — Discharge Instructions (Signed)
Continue all your prescribed medications. Increase hydration Follow-up with the primary care physician in 2 days Return to emergency department for reevaluation if worsening of palpitations or dizziness.

## 2022-06-09 NOTE — ED Provider Notes (Signed)
[facility] EMERGENCY DEPARTMENT Provider Note   CSN: [id number] Arrival date & time: 06/09/22  1741     History  Chief Complaint  Patient presents with   Palpitations    Virginia [name] is a 41 y.o. female.  41 year old female with history of Hypertension, depression, panic attack presented emergency department for evaluation of palpitations, dizziness and chest pressure. Patient reports she is feeling palpitations, pressure in her chest and dizziness/lightheadedness since morning.  She also reports feeling hot and cold.  She denies chest pain, nausea, vomiting, abdominal pain.  She had an appointment with primary care physician at 5 PM.  Her symptoms were slightly worse when she were at the PCP office.  Patient reports her blood pressure was low on the first reading and on the second reading the blood pressure was reported high.  Patient also experienced slight chest pain when she was at the PCPs office.  PCP referred patient to emergency department for further evaluation.  Upon arrival in the emergency department patient is asymptomatic.  She denies dizziness, palpitations, chest pain, shortness of breath, nausea or vomiting.  Patient stated her symptoms resolved approximately 45 minutes later.  Patient stated her PCP added Wellbutrin which she started about a week ago and she is also started on hydrochlorothiazide 12.5 mg for hypertension which she started last week Saturday.  Patient further reported she works alone and she is no going to start working with other group of people which is making her anxious.  She patient is calm, cooperative no acute visible distress.  Her vital signs are stable.       Palpitations Associated symptoms: dizziness        Home Medications Prior to Admission medications   Medication Sig Start Date End Date Taking? Authorizing Provider  buPROPion (WELLBUTRIN XL) 150 MG 24 hr tablet Take by mouth. 05/22/22  Yes [provider]   albuterol (PROVENTIL HFA;VENTOLIN HFA) 108 (90 Base) MCG/ACT inhaler Inhale 1-2 puffs into the lungs every 6 (six) hours as needed for wheezing or shortness of breath. 01/12/18   Couture, Cortni S, PA-C  benzonatate (TESSALON) 100 MG capsule Take 1 capsule (100 mg total) by mouth every 8 (eight) hours. 01/12/18   Couture, Cortni S, PA-C  bismuth subsalicylate (PEPTO BISMOL) 262 MG chewable tablet Chew 524 mg by mouth as needed for indigestion.    [provider]  chlorpheniramine (CHLOR-TRIMETON) 4 MG tablet Take 4 mg by mouth as needed for allergies.    [provider]  chlorpheniramine-HYDROcodone (TUSSIONEX PENNKINETIC ER) 10-8 MG/5ML Take 5 mLs by mouth every 12 (twelve) hours as needed for cough. 02/09/22   Milagros Loll, MD  FEROSUL 325 (65 Fe) MG tablet Take 325 mg by mouth daily. 11/22/17   [provider]  ondansetron (ZOFRAN ODT) 4 MG disintegrating tablet Allow 1-2 tablets to dissolve in your mouth every 8 hours as needed for nausea/vomiting 05/25/18   Loleta Rose, MD  ondansetron (ZOFRAN) 4 MG tablet Take 2 tablets (8 mg total) by mouth every 4 (four) hours as needed for nausea or vomiting. 12/31/17   Isa Rankin, MD  pantoprazole (PROTONIX) 40 MG tablet Take 1 tablet (40 mg total) by mouth daily. Patient not taking: Reported on 01/12/2018 10/22/17   Ward, Layla Maw, DO  sucralfate (CARAFATE) 1 GM/10ML suspension Take 10 mLs (1 g total) by mouth 4 (four) times daily -  with meals and at bedtime. Patient not taking: Reported on 01/12/2018 10/22/17   Ward, Baxter Hire  N, DO  VIENVA 0.1-20 MG-MCG tablet Take 1 tablet by mouth daily. 11/25/17   [provider]      Allergies    Patient has no known allergies.    Review of Systems   Review of Systems  Cardiovascular:  Positive for palpitations.  Neurological:  Positive for dizziness.    Physical Exam Updated Vital Signs BP (!) 149/90   Pulse 85   Temp 97.8 F (36.6 C)   Resp 20   Ht 5\' 2"   (1.575 m)   Wt 82.1 kg   SpO2 99%   BMI 33.11 kg/m  Physical Exam Vitals and nursing note reviewed.  Constitutional:      Appearance: Normal appearance.  HENT:     Head: Normocephalic.  Eyes:     Extraocular Movements: Extraocular movements intact.     Conjunctiva/sclera: Conjunctivae normal.     Pupils: Pupils are equal, round, and reactive to light.  Cardiovascular:     Rate and Rhythm: Normal rate and regular rhythm.     Pulses: Normal pulses.     Heart sounds: Normal heart sounds.  Pulmonary:     Effort: Pulmonary effort is normal.     Breath sounds: Normal breath sounds. No wheezing, rhonchi or rales.  Skin:    General: Skin is warm.     Capillary Refill: Capillary refill takes less than 2 seconds.  Neurological:     Mental Status: She is alert and oriented to person, place, and time.  Psychiatric:        Mood and Affect: Mood normal.     ED Results / Procedures / Treatments   Labs (all labs ordered are listed, but only abnormal results are displayed) Labs Reviewed  BASIC METABOLIC PANEL - Abnormal; Notable for the following components:      Result Value   Sodium 132 (*)    Potassium 3.2 (*)    Chloride 97 (*)    Calcium 8.7 (*)    All other components within normal limits  CBC  PREGNANCY, URINE  CBG MONITORING, ED  TROPONIN I (HIGH SENSITIVITY)  TROPONIN I (HIGH SENSITIVITY)    EKG EKG Interpretation  Date/Time:  Tuesday June 09 2022 17:56:11 EDT Ventricular Rate:  83 PR Interval:  174 QRS Duration: 72 QT Interval:  382 QTC Calculation: 448 R Axis:   86 Text Interpretation: Normal sinus rhythm Normal ECG When compared with ECG of 09-Feb-2022 18:32, PREVIOUS ECG IS PRESENT Confirmed by Wandra Arthurs [id number]) on 06/09/2022 6:32:22 PM  Radiology DG Chest 2 View  Result Date: 06/09/2022 CLINICAL DATA:  Chest pain with palpitations. Possible panic attack. EXAM: CHEST - 2 VIEW COMPARISON:  Radiographs 02/09/2022.  CT 02/09/2022. FINDINGS: The heart  size and mediastinal contours are normal. The lungs are clear. There is no pleural effusion or pneumothorax. No acute osseous findings are identified. IMPRESSION: Stable chest.  No evidence of active cardiopulmonary process. Electronically Signed   By: Richardean Sale M.D.   On: 06/09/2022 18:20    Procedures Procedures    Medications Ordered in ED Medications  sodium chloride 0.9 % bolus 1,000 mL (0 mLs Intravenous Stopped 06/09/22 2030)    ED Course/ Medical Decision Making/ A&P                           Medical Decision Making 41 year old female with history of hypertension, depression, panic attack presented to emergency department for evaluation of dizziness and palpitations. Patient reported  she was experiencing palpitations, dizziness/lightheadedness and chest pressure since morning.  She had an appointment with primary care physician today at 5 PM.  When she arrived at PCPs office her symptoms got slightly worse where she experienced slight chest pain and worsening of her palpitations and dizziness.  She endorses her blood pressure was low on the initial reading and noted to be higher on the subsequent reading.  Upon arrival in the emergency department patient is asymptomatic.  She denies chest pain, palpitations, dizziness, shortness of breath, abdominal pain, nausea or vomiting. Her vital signs stayed stable.  S1-S2 normal without gallop or murmurs.  Lungs clear to auscultation.  No wheeze, Rales, rhonchi appreciated to auscultation.  No pedal edema. Differential diagnosis include ACS, arrhythmia, hypovolemia, electrolyte abnormalities, panic attack.   I will do CBC, BMP, troponins, EKG, chest x-ray.  We will also give a bolus of normal saline.    Heart score: Patient scored low on heart score for ACS. Risk of MACE of 0.9-1.7%. Wells score: low risk for PE Disposition based on the results.  I have personally reviewed the labs, EKG and imaging studies. Trace low sodium and  potassium.  CBC unremarkable.  EKG normal sinus.  No ST or T wave abnormalities noted.  Chest x-ray negative for acute cardiopulmonary disease.  Initial troponin results were within normal limits.  We will continue to monitor the patient and evaluate the patient after the second troponin results. Second troponin within normal limits.  I have discussed the findings with the patient.  Her presentation is highly likely with anxiety/panic attack.  Highly unlikely acute coronary syndrome, cardiac arrhythmia or neurological issue.  Patient in no distress and overall condition improved here in the ED. Detailed discussions were had with the patient regarding current findings, and need for close f/u with PCP or on call doctor. The patient has been instructed to return immediately if the symptoms worsen in any way for re-evaluation. Patient verbalized understanding and is in agreement with current care plan. All questions answered prior to discharge.   Amount and/or Complexity of Data Reviewed Labs: ordered. Radiology: ordered.          Final Clinical Impression(s) / ED Diagnoses Final diagnoses:  Palpitations  Dizziness and giddiness    Rx / DC Orders ED Discharge Orders     None         Reinaldo Raddle, MD 06/09/22 2057    Charlynne Pander, MD 06/09/22 2322

## 2022-06-09 NOTE — ED Triage Notes (Signed)
Was at primary care when she had a panic attack, states her BP dropped, got chills and felt palpitations, lightheadedness, chest pain. States has been having palpitations and intermittent shortness of breath all day.   Recently started taking Wellbutrin, provider thinks this could be causing symptoms.

## 2023-01-07 IMAGING — MG MM DIGITAL DIAGNOSTIC UNILAT*R* W/ TOMO W/ CAD
8 series · 9 of 24 positions shown · non-contrast
Comparison: Previous exam(s).

CLINICAL DATA: Patient recalled from screening for right breast
asymmetry.

EXAM:
DIGITAL DIAGNOSTIC UNILATERAL RIGHT MAMMOGRAM WITH TOMOSYNTHESIS AND
CAD; ULTRASOUND RIGHT BREAST LIMITED
TECHNIQUE: Right digital diagnostic mammography and breast tomosynthesis was
performed. The images were evaluated with computer-aided detection.;
Targeted ultrasound examination of the right breast was performed

[R CC synth-2D (1 of 2)]
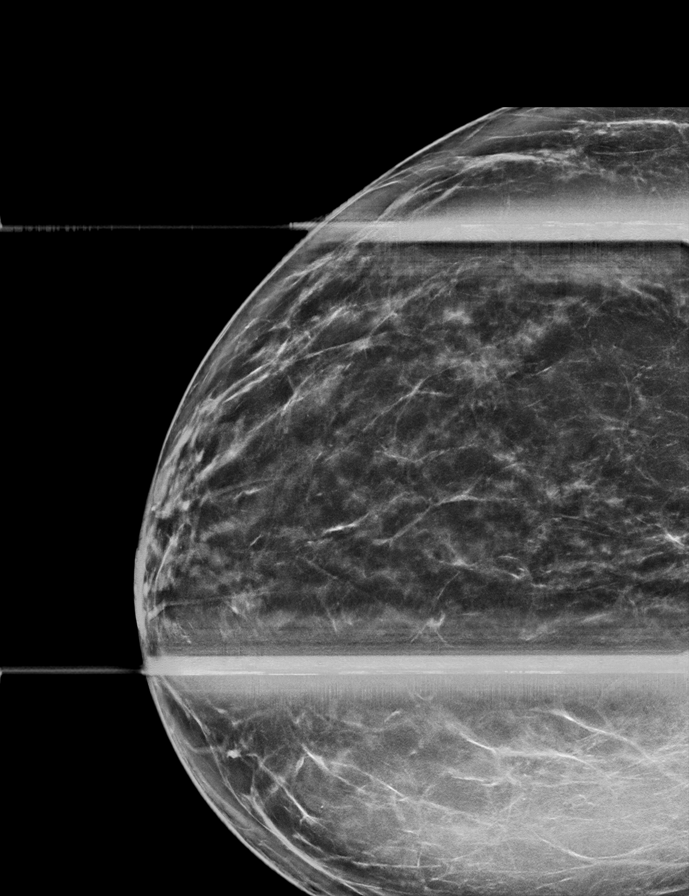

[R CC synth-2D (2 of 2)]
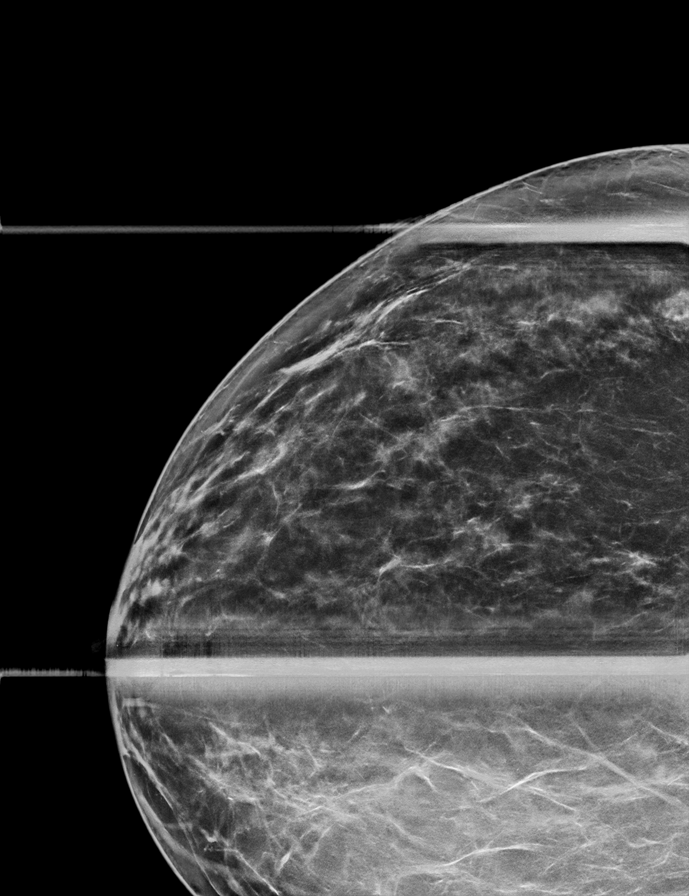

[R MLO synth-2D (1 of 2)]
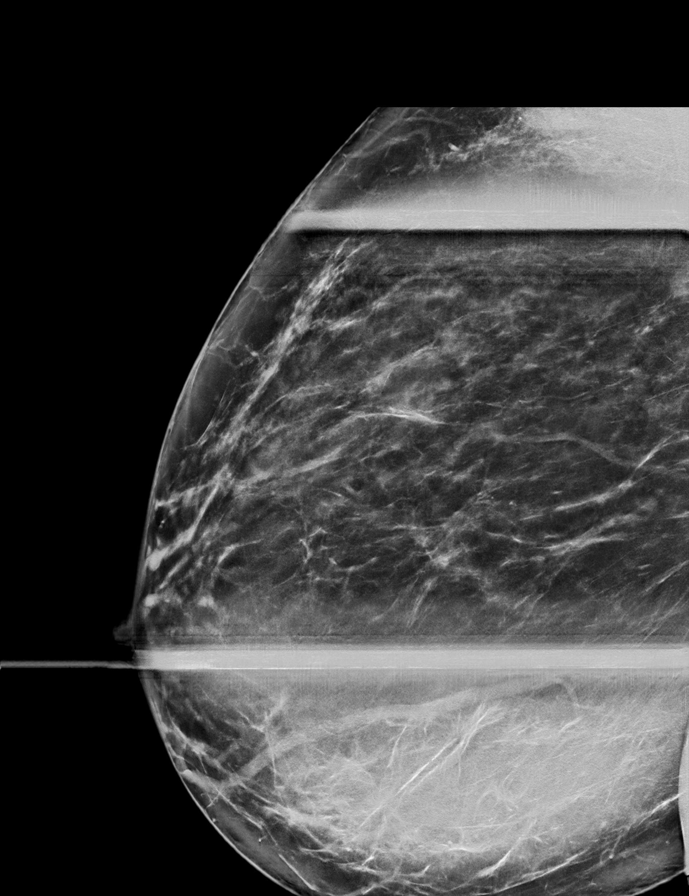

[R MLO synth-2D (2 of 2)]
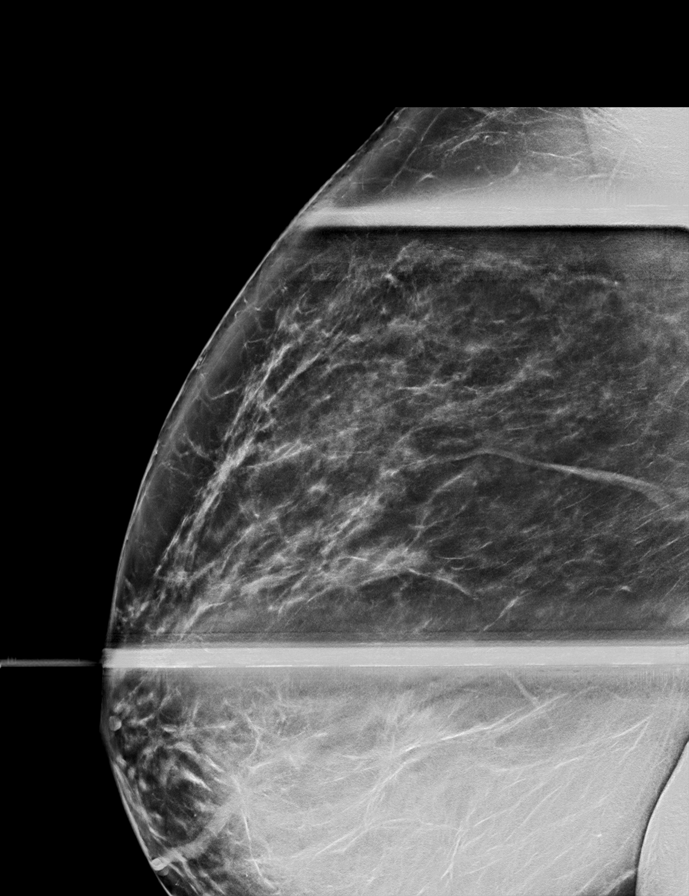

[R MLO tomo · 2 of 88 frames shown (1 of 2)]
[frame 29/88]
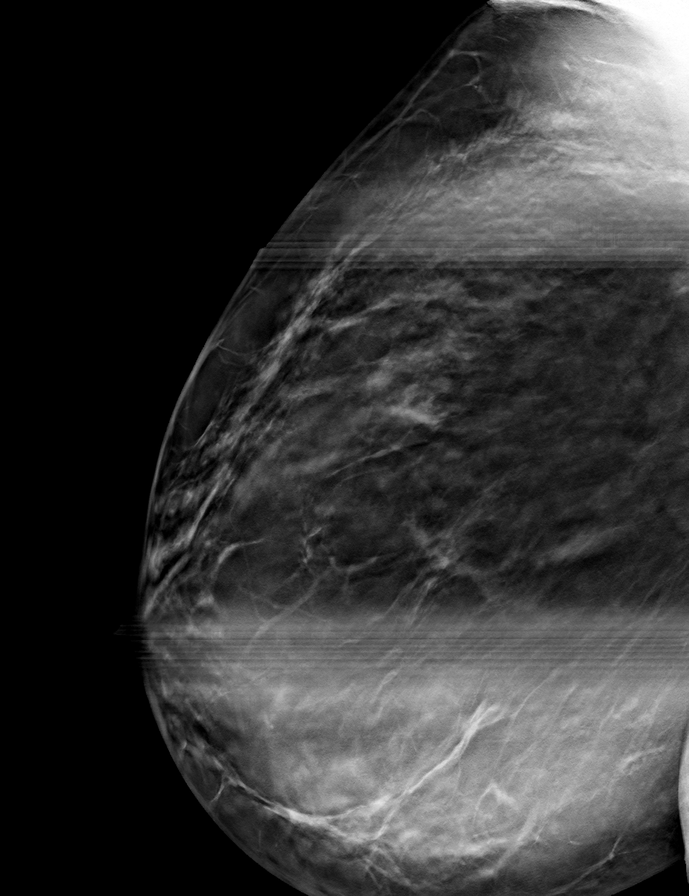
[frame 45/88]
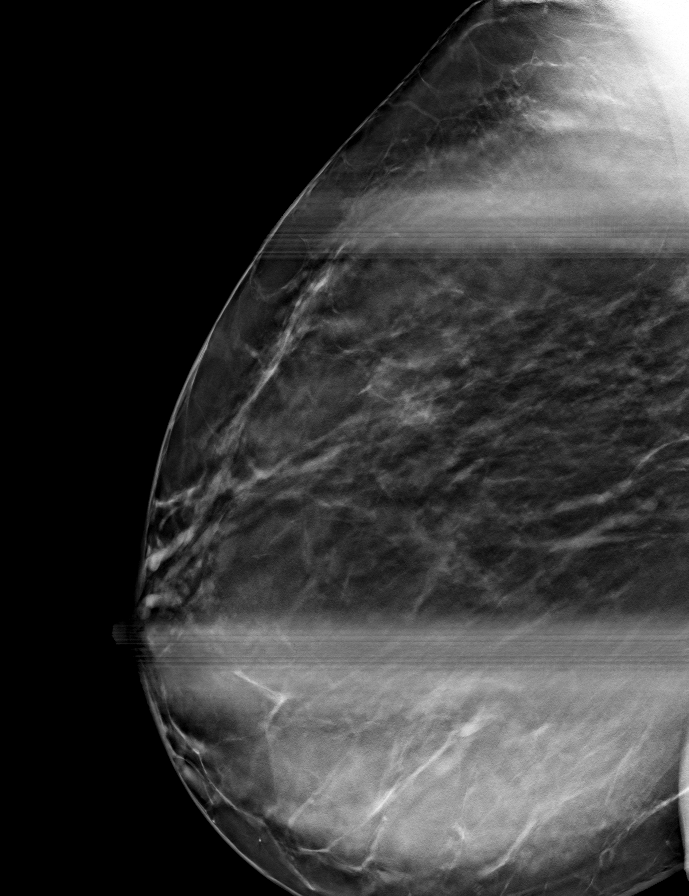

[R CC tomo (1 of 2) · tomo slice 41/80.0]
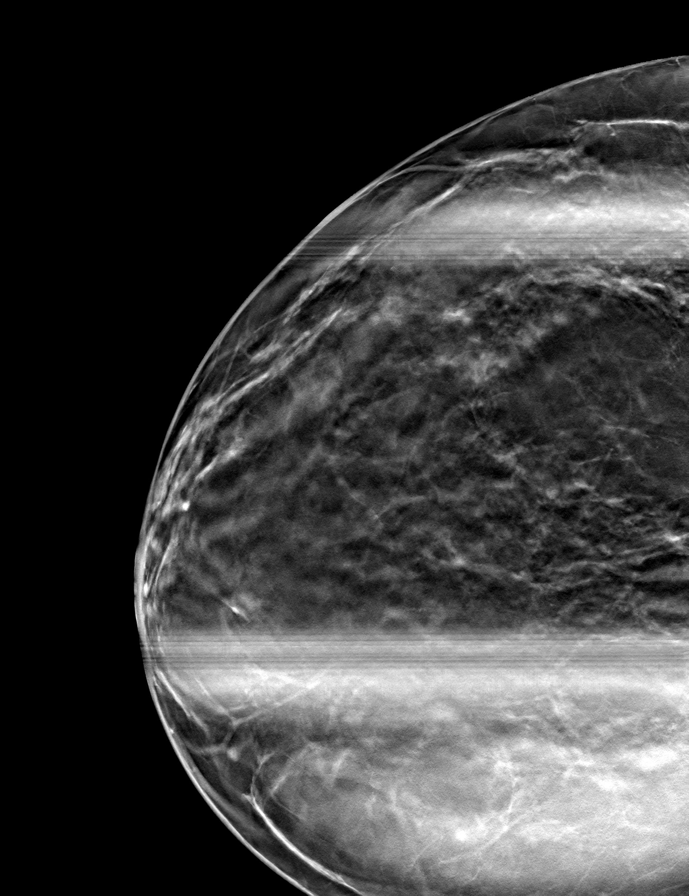

[R MLO tomo (2 of 2) · tomo slice 45/89.0]
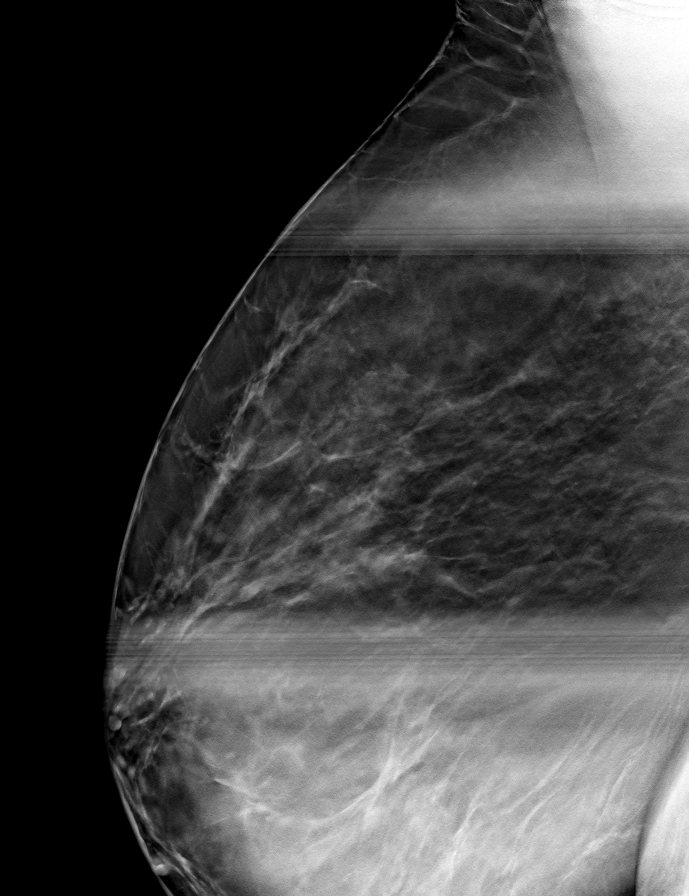

[R CC tomo (2 of 2) · tomo slice 41/80.0]
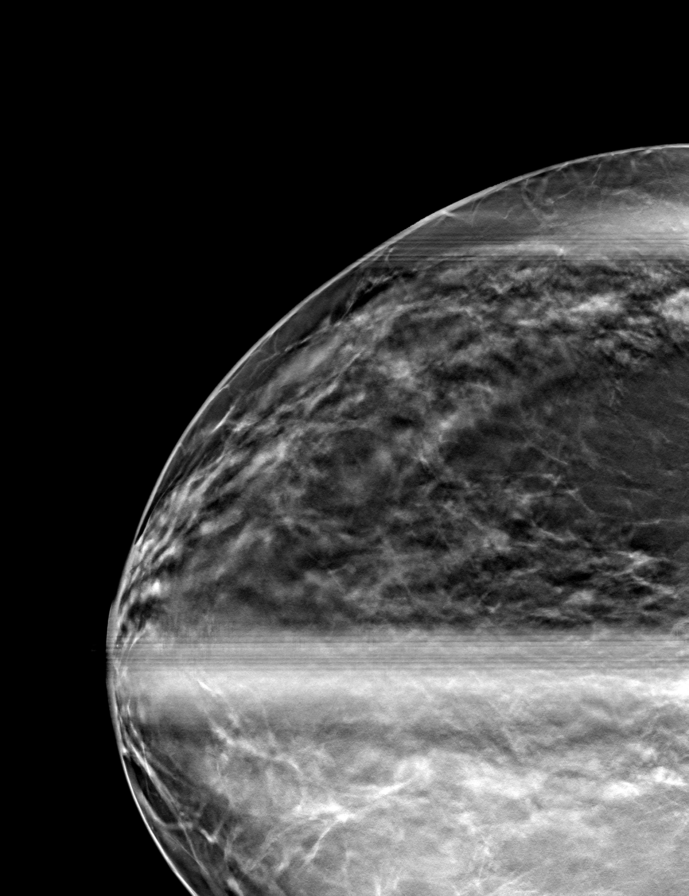

[9 of 24 positions shown; findings below may reference images not displayed]

ACR Breast Density Category c: The breast tissue is heterogeneously
dense, which may obscure small masses.
FINDINGS: Questioned asymmetry within the upper-outer right breast
predominately effaced with additional imaging suggestive of dense
fibroglandular tissue.

Targeted ultrasound is performed, showing dense tissue without
suspicious mass within the upper-outer right breast. Additionally
there is a small 4 mm cyst right breast 10 o'clock position 9 cm
from nipple.
IMPRESSION: No mammographic evidence for malignancy.

RECOMMENDATION:
Screening mammogram in one year.(Code:O3-7-4DY)

I have discussed the findings and recommendations with the patient.
If applicable, a reminder letter will be sent to the patient
regarding the next appointment.

BI-RADS CATEGORY  1: Negative.

## 2023-02-23 ENCOUNTER — Other Ambulatory Visit: Payer: Self-pay

## 2023-02-23 ENCOUNTER — Encounter (HOSPITAL_BASED_OUTPATIENT_CLINIC_OR_DEPARTMENT_OTHER): Payer: Self-pay | Admitting: Obstetrics and Gynecology

## 2023-02-23 NOTE — Progress Notes (Addendum)
Your procedure is scheduled on Thursday, 03/11/2023.  Report to [facility] AT  10:15 AM.   Call this number if you have problems the morning of surgery  :[phone].   OUR ADDRESS IS [address].  WE ARE LOCATED IN [facility].  PLEASE BRING YOUR INSURANCE CARD AND PHOTO ID DAY OF SURGERY.  ONLY 2 PEOPLE ARE ALLOWED IN  WAITING  ROOM                                      REMEMBER:  DO NOT EAT FOOD, CANDY GUM OR MINTS  AFTER MIDNIGHT THE NIGHT BEFORE YOUR SURGERY . YOU MAY HAVE CLEAR LIQUIDS FROM MIDNIGHT THE NIGHT BEFORE YOUR SURGERY UNTIL  9:15 AM. NO CLEAR LIQUIDS AFTER   9:15 AM DAY OF SURGERY. FOLLOW INSTRUCTIONS FOR PRE-SURGERY DRINKS. INSTRUCTIONS ARE PROVIDED IN THE BAG WITH THE DRINKS.  YOU MAY  BRUSH YOUR TEETH MORNING OF SURGERY AND RINSE YOUR MOUTH OUT, NO CHEWING GUM CANDY OR MINTS.     CLEAR LIQUID DIET    Allowed      Water                                                                   Coffee and tea, regular and decaf  (NO cream or milk products of any type, may sweeten)                         Carbonated beverages, regular and diet                                    Sports drinks like Gatorade _____________________________________________________________________     TAKE ONLY THESE MEDICATIONS MORNING OF SURGERY: Albuterol inhaler if needed (please bring), Ondansetron if needed, Cetirizine if needed,Omeprazole, birth control pill                                        DO NOT WEAR JEWERLY/  METAL/  PIERCINGS (INCLUDING NO PLASTIC PIERCINGS) DO NOT WEAR LOTIONS, POWDERS, PERFUMES OR NAIL POLISH ON YOUR FINGERNAILS. TOENAIL POLISH IS OK TO WEAR. DO NOT SHAVE FOR 48 HOURS PRIOR TO DAY OF SURGERY.  CONTACTS, GLASSES, OR DENTURES MAY NOT BE WORN TO SURGERY.  REMEMBER: NO SMOKING, VAPING ,  DRUGS OR ALCOHOL FOR 24 HOURS BEFORE YOUR SURGERY.                                    [facility] IS NOT RESPONSIBLE  FOR  ANY BELONGINGS.                                                                    Mar[facility] Kitchen  [facility] - Preparing for Surgery Before surgery, you can play an important role.  Because skin is not sterile, your skin needs to be as free of germs as possible.  You can reduce the number of germs on your skin by washing with CHG (chlorahexidine gluconate) soap before surgery.  CHG is an antiseptic cleaner which kills germs and bonds with the skin to continue killing germs even after washing. Please DO NOT use if you have an allergy to CHG or antibacterial soaps.  If your skin becomes reddened/irritated stop using the CHG and inform your nurse when you arrive at Short Stay. Do not shave (including legs and underarms) for at least 48 hours prior to the first CHG shower.  You may shave your face/neck. Please follow these instructions carefully:  1.  Shower with CHG Soap the night before surgery and the  morning of Surgery.  2.  If you choose to wash your hair, wash your hair first as usual with your  normal  shampoo.  3.  After you shampoo, rinse your hair and body thoroughly to remove the  shampoo.                                        4.  Use CHG as you would any other liquid soap.  You can apply chg directly  to the skin and wash , chg soap provided, night before and morning of your surgery.  5.  Apply the CHG Soap to your body ONLY FROM THE NECK DOWN.   Do not use on face/ open                           Wound or open sores. Avoid contact with eyes, ears mouth and genitals (private parts).                       Wash face,  Genitals (private parts) with your normal soap.             6.  Wash thoroughly, paying special attention to the area where your surgery  will be performed.  7.  Thoroughly rinse your body with warm water from the neck down.  8.  DO NOT shower/wash with your normal soap after using and rinsing off  the CHG Soap.             9.  Pat yourself dry with a clean towel.            10.   Wear clean pajamas.            11.  Place clean sheets on your bed the night of your first shower and do not  sleep with pets. Day of Surgery : Do not apply any lotions/ powders the morning of surgery.  Please wear clean clothes to the hospital/surgery center.  IF YOU HAVE ANY SKIN IRRITATION OR PROBLEMS WITH THE SURGICAL SOAP, PLEASE GET A BAR OF GOLD DIAL SOAP AND SHOWER THE NIGHT BEFORE YOUR SURGERY AND THE MORNING OF YOUR SURGERY. PLEASE LET THE NURSE KNOW MORNING OF YOUR SURGERY IF YOU HAD ANY PROBLEMS WITH THE SURGICAL SOAP.   YOUR SURGEON MAY HAVE REQUESTED EXTENDED RECOVERY TIME AFTER YOUR SURGERY. IT COULD BE A  JUST A FEW HOURS  UP TO AN OVERNIGHT STAY.  YOUR SURGEON SHOULD HAVE DISCUSSED  THIS WITH YOU PRIOR TO YOUR SURGERY. IN THE EVENT YOU NEED TO STAY OVERNIGHT PLEASE REFER TO THE FOLLOWING GUIDELINES. YOU MAY HAVE UP TO 4 VISITORS  MAY VISIT IN THE EXTENDED RECOVERY ROOM UNTIL 800 PM ONLY.  ONE  VISITOR AGE 52 AND OVER MAY SPEND THE NIGHT AND MUST BE IN EXTENDED RECOVERY ROOM NO LATER THAN 800 PM . YOUR DISCHARGE TIME AFTER YOU SPEND THE NIGHT IS 900 AM THE MORNING AFTER YOUR SURGERY. YOU MAY PACK A SMALL OVERNIGHT BAG WITH TOILETRIES FOR YOUR OVERNIGHT STAY IF YOU WISH.  REGARDLESS OF IF YOU STAY OVER NIGHT OR ARE DISCHARGED THE SAME DAY YOU WILL BE REQUIRED TO HAVE A RESPONSIBLE ADULT (18 YRS OLD OR OLDER) STAY WITH YOU FOR AT LEAST THE FIRST 24 HOURS  YOUR PRESCRIPTION MEDICATIONS WILL BE PROVIDED DURING YOUR HOSPITAL STAY.  ________________________________________________________________________                                                        QUESTIONS Mechele Claude PRE OP NURSE PHONE [phone].

## 2023-02-23 NOTE — Progress Notes (Addendum)
Spoke w/ via phone for pre-op interview---Virginia [name] needs dos----   urine pregnancy            [name] results-----03/05/23 [name] appt for cbc, type & screen, bmp COVID test -----patient states asymptomatic no test needed Arrive at -------1015 on Thursday, 03/11/2023 NPO after MN NO Solid Food.  Clear liquids from MN until---0915 Med rec completed Medications to take morning of surgery -----Albuterol inhaler (please bring), Zofran prn, Ondansetron prn, Cetirizine prn,Omeprazole, BCP Diabetic medication -----n/a Patient instructed no nail polish to be worn day of surgery Patient instructed to bring photo id and insurance card day of surgery Patient aware to have Driver (ride ) / caregiver    for 24 hours after surgery - mother and sister Patient Special Instructions -----Extended / overnight instructions given. Follow instructions provided with Ensure pre-surgery drinks. Pre-Op special Instructions -----none Patient verbalized understanding of instructions that were given at this phone interview. Patient denies shortness of breath, chest pain, fever, cough at this phone interview.

## 2023-02-25 NOTE — H&P (Signed)
Virginia [name] is a 42 y.o. 42 YO female, G:0 presents for laparoscopic myomectomy because of symptomatic uterine fibroids.  The patient underwent evaluation for abnormal vaginal bleeding and dyspareunia in 2018 and was diagnosed with uterine fibroids.  Her menstrual flow and                                  related symptoms were such that she would miss work and could find no relief from cramping with analgesia plus a heating pad.  Her only relief from cramping was soaking in a tub of warm water and muscle relaxants. Subsequently she  was placed on oral contraceptives  which resulted in a 4 day menstrual flow with panty liner change twice a day and minimal cramping. In the years that followed she developed  positional dyspareunia, urinary frequency and sluggish bowel function.   On occasion she would experience random sharp pelvic pain on her right side with no known trigger.  A pelvic ultrasound in May 2024 revealed a retroverted uterus with a volume of 139 cc, measuring 7.15 x 5.35 x 6.41 cm, endometrium: 1.05 mm  and  #3 pedunculated fibroids: anterior -3.57 cm and  #2 right lateral: 1.57 cm and 1.75 cm; left ovary-3.5 cm and right ovary-3.09 cm.  Given her symptoms and desire to preserve childbearing potential, the patient wants to proceed with surgical management of her fibroids.     Past Medical History  OB History: G:0  GYN History: menarche : 42 YO;   LMP: 01/29/2023;    Contraception oral contraceptives (estrogen/progesterone) ;   Remote history of abnormal PAP smear   treated with laser therapy and normal since. Last PAP smear: 2019-normal  Medical History: Thyroid Cyst, Asthma, Eczema, IBS, Depression, Anemia, Gastroparesis, Migraine, Menorrhagia and Fibroids,   Surgical History: 1989: Bilateral Inguinal and Ventral Hernia Repairs and 2016 Liver Biopsy (benign) Denies problems with anesthesia or history of blood transfusions  Family History: Breast Cancer, Hypertension, Thyroid Disease, Neck  Tumor  Social History:   Single and employed with [employer];  Denies tobacco use and rarely consumes alcohol   Medications:  Albuterol Inhaler HFA 90 mcg 2 puffs every 4 hours prn HCTZ 12.5 mg daily Linzess 72 mg prn Naproxen 550 mg bid pc prn Vienva 0.1/20 daily  No Known Allergies  ROS: Admits to glasses and skin rash due to eczema but  denies headache, vision changes, nasal congestion, dysphagia, tinnitus, dizziness, hoarseness, cough,  chest pain, shortness of breath, nausea, vomiting, diarrhea,constipation,  urgency  dysuria, hematuria, vaginitis symptoms, pelvic pain, swelling of joints,easy bruising,  myalgias, arthralgias,  unexplained weight loss and except as is mentioned in the history of present illness, patient's review of systems is otherwise negative.    Physical Exam  Bp: 132/82;  Weight: 178 lbs.; Height: 5'2";  BMI: 32.6  Neck: supple without masses or thyromegaly Lungs: clear to auscultation Heart: regular rate and rhythm Abdomen: soft, non-tender and no organomegaly Pelvic:EGBUS- wnl; vagina-normal rugae; uterus-irregular and upper limites of normal size, cervix without lesions or motion tenderness; adnexae-no tenderness or masses Extremities:  no clubbing, cyanosis or edema   Assesment: Symptomatic Uterine Fibroids   Disposition:The robot-assisted hysterectomy was reviewed with the patient along with the indication for her procedure. Benefits of the robotic approach include lesser postoperative pain, less blood loss during surgery, reduced risk of injury to other organs, due to better visualization with a 3-D HD 10  times magnifying camera; shorter hospital stay between 0-1 night and rapid recovery with return to daily routine in 2-3 weeks (however, nothing in the vagina for 6 weeks). Although the robot-assisted hysterectomy has a longer operative time than traditional laparotomy, the benefits usually outweigh the risks, in a patient with good medical  history,   Risks include bleeding, infection, injury to other organs (bladder more vulnerable if previous cesarean delivery) , need for laparotomy, transient post-operative facial edema, increased risk of pelvic prolapse (associated with any hysterectomy) as well as earlier onset of menopause. Preservation of the ovaries was also reviewed and recommended. Finally, we recommended bilateral salpingectomy for lifelong reduction of ovarian cancer. A Miralax Bowel Prep was given to the patient to complete the day before her surgery.  The patient's questions were answered and she verbalized understanding of the risks and pre-operative instructions. The patient has consented to proceed with a Robot Assisted Laparoscopic Hysterectomy with Bilateral Salpingectomy at [facility] on March 04, 2023 at 8 a.m   CSN# [id number]   Marquis Lunch. Lowell Guitar, PA-C  for Dr. Crist Fat.Rivard

## 2023-03-05 ENCOUNTER — Encounter (HOSPITAL_COMMUNITY)
Admission: RE | Admit: 2023-03-05 | Discharge: 2023-03-05 | Disposition: A | Discharge status: Home / Self Care | Payer: BC Managed Care – PPO | Source: Ambulatory Visit | Attending: Obstetrics and Gynecology | Admitting: Obstetrics and Gynecology

## 2023-03-05 DIAGNOSIS — Z01812 Encounter for preprocedural laboratory examination: Secondary | ICD-10-CM | POA: Diagnosis present

## 2023-03-05 DIAGNOSIS — Z01818 Encounter for other preprocedural examination: Secondary | ICD-10-CM

## 2023-03-05 DIAGNOSIS — D219 Benign neoplasm of connective and other soft tissue, unspecified: Secondary | ICD-10-CM | POA: Insufficient documentation

## 2023-03-05 LAB — BASIC METABOLIC PANEL
Anion gap: 6 (ref 5–15)
BUN: 19 mg/dL (ref 6–20)
CO2: 25 mmol/L (ref 22–32)
Calcium: 8.7 mg/dL — ABNORMAL LOW (ref 8.9–10.3)
Chloride: 105 mmol/L (ref 98–111)
Creatinine, Ser: 0.84 mg/dL (ref 0.44–1.00)
GFR, Estimated: >60 mL/min (ref >60)
Glucose, Bld: 98 mg/dL (ref 70–99)
Potassium: 3.8 mmol/L (ref 3.5–5.1)
Sodium: 136 mmol/L (ref 135–145)

## 2023-03-05 LAB — CBC
HCT: 35.4 % — ABNORMAL LOW (ref 36.0–46.0)
Hemoglobin: 11.1 g/dL — ABNORMAL LOW (ref 12.0–15.0)
MCH: 28.1 pg (ref 26.0–34.0)
MCHC: 31.4 g/dL (ref 30.0–36.0)
MCV: 89.6 fL (ref 80.0–100.0)
Platelets: 271 10*3/uL (ref 150–400)
RBC: 3.95 MIL/uL (ref 3.87–5.11)
RDW: 12.4 % (ref 11.5–15.5)
WBC: 5.4 10*3/uL (ref 4.0–10.5)
nRBC: 0 % (ref 0.0–0.2)

## 2023-03-05 LAB — TYPE AND SCREEN

## 2023-03-11 ENCOUNTER — Other Ambulatory Visit: Payer: Self-pay

## 2023-03-11 ENCOUNTER — Encounter (HOSPITAL_BASED_OUTPATIENT_CLINIC_OR_DEPARTMENT_OTHER)
Admission: RE | Disposition: A | Discharge status: Home / Self Care | Payer: Self-pay | Source: Ambulatory Visit | Attending: Obstetrics and Gynecology

## 2023-03-11 ENCOUNTER — Encounter (HOSPITAL_BASED_OUTPATIENT_CLINIC_OR_DEPARTMENT_OTHER): Payer: Self-pay | Admitting: Obstetrics and Gynecology

## 2023-03-11 ENCOUNTER — Ambulatory Visit (HOSPITAL_BASED_OUTPATIENT_CLINIC_OR_DEPARTMENT_OTHER): Payer: BC Managed Care – PPO | Admitting: Anesthesiology

## 2023-03-11 ENCOUNTER — Ambulatory Visit (HOSPITAL_BASED_OUTPATIENT_CLINIC_OR_DEPARTMENT_OTHER)
Admission: RE | Admit: 2023-03-11 | Discharge: 2023-03-11 | Disposition: A | Discharge status: Home / Self Care | Payer: BC Managed Care – PPO | Source: Ambulatory Visit | Attending: Obstetrics and Gynecology | Admitting: Obstetrics and Gynecology

## 2023-03-11 DIAGNOSIS — N941 Unspecified dyspareunia: Secondary | ICD-10-CM | POA: Insufficient documentation

## 2023-03-11 DIAGNOSIS — Z01818 Encounter for other preprocedural examination: Secondary | ICD-10-CM

## 2023-03-11 DIAGNOSIS — D259 Leiomyoma of uterus, unspecified: Secondary | ICD-10-CM | POA: Diagnosis not present

## 2023-03-11 DIAGNOSIS — D251 Intramural leiomyoma of uterus: Secondary | ICD-10-CM

## 2023-03-11 DIAGNOSIS — D219 Benign neoplasm of connective and other soft tissue, unspecified: Secondary | ICD-10-CM

## 2023-03-11 HISTORY — DX: Anemia, unspecified: D64.9

## 2023-03-11 HISTORY — DX: Presence of spectacles and contact lenses: Z97.3

## 2023-03-11 HISTORY — PX: CHROMOPERTUBATION: SHX6288

## 2023-03-11 HISTORY — DX: Unilateral inguinal hernia, without obstruction or gangrene, not specified as recurrent: K40.90

## 2023-03-11 HISTORY — DX: Headache, unspecified: R51.9

## 2023-03-11 HISTORY — DX: Myoneural disorder, unspecified: G70.9

## 2023-03-11 HISTORY — PX: ROBOT ASSISTED MYOMECTOMY: SHX5142

## 2023-03-11 HISTORY — DX: Personal history of other diseases of the circulatory system: Z86.79

## 2023-03-11 LAB — TYPE AND SCREEN
ABO/RH(D): O POS
Antibody Screen: NEGATIVE

## 2023-03-11 LAB — ABO/RH: ABO/RH(D): O POS

## 2023-03-11 LAB — POCT PREGNANCY, URINE: Preg Test, Ur: NEGATIVE

## 2023-03-11 SURGERY — MYOMECTOMY, ROBOT-ASSISTED
Anesthesia: General

## 2023-03-11 MED ORDER — ACETAMINOPHEN 500 MG PO TABS
ORAL_TABLET | ORAL | Status: AC
  Filled 2023-03-11: qty 2

## 2023-03-11 MED ORDER — CEFAZOLIN SODIUM-DEXTROSE 2-4 GM/100ML-% IV SOLN
2.0000 g | INTRAVENOUS | Status: AC
  Administered 2023-03-11: 2 g via INTRAVENOUS

## 2023-03-11 MED ORDER — FENTANYL CITRATE (PF) 100 MCG/2ML IJ SOLN
INTRAMUSCULAR | Status: AC
  Filled 2023-03-11: qty 2

## 2023-03-11 MED ORDER — SODIUM CHLORIDE 0.9 % IV SOLN
INTRAVENOUS | Status: DC | PRN
  Administered 2023-03-11: 112 mL

## 2023-03-11 MED ORDER — SODIUM CHLORIDE 0.9 % IR SOLN
Status: DC | PRN
  Administered 2023-03-11: 1000 mL

## 2023-03-11 MED ORDER — DEXAMETHASONE SODIUM PHOSPHATE 10 MG/ML IJ SOLN
INTRAMUSCULAR | Status: DC | PRN
  Administered 2023-03-11: 10 mg via INTRAVENOUS

## 2023-03-11 MED ORDER — SCOPOLAMINE 1 MG/3DAYS TD PT72
MEDICATED_PATCH | TRANSDERMAL | Status: AC
  Filled 2023-03-11: qty 1

## 2023-03-11 MED ORDER — VASOPRESSIN 20 UNIT/ML IV SOLN
INTRAVENOUS | Status: DC | PRN
  Administered 2023-03-11: 16 mL via INTRAMUSCULAR

## 2023-03-11 MED ORDER — SCOPOLAMINE 1 MG/3DAYS TD PT72
1.0000 | MEDICATED_PATCH | TRANSDERMAL | Status: DC
  Administered 2023-03-11: 1.5 mg via TRANSDERMAL

## 2023-03-11 MED ORDER — SODIUM CHLORIDE (PF) 0.9 % IJ SOLN
INTRAMUSCULAR | Status: AC
  Filled 2023-03-11: qty 10

## 2023-03-11 MED ORDER — ACETAMINOPHEN 325 MG PO TABS: 325.0000 mg | ORAL_TABLET | ORAL | Status: DC | PRN

## 2023-03-11 MED ORDER — PHENYLEPHRINE 80 MCG/ML (10ML) SYRINGE FOR IV PUSH (FOR BLOOD PRESSURE SUPPORT)
PREFILLED_SYRINGE | INTRAVENOUS | Status: AC
  Filled 2023-03-11: qty 10

## 2023-03-11 MED ORDER — ACETAMINOPHEN 160 MG/5ML PO SOLN: 325.0000 mg | ORAL | Status: DC | PRN

## 2023-03-11 MED ORDER — ONDANSETRON HCL 4 MG/2ML IJ SOLN: 4.0000 mg | Freq: Four times a day (QID) | INTRAMUSCULAR | Status: DC | PRN

## 2023-03-11 MED ORDER — PROPOFOL 10 MG/ML IV BOLUS
INTRAVENOUS | Status: DC | PRN
  Administered 2023-03-11: 150 mg via INTRAVENOUS

## 2023-03-11 MED ORDER — FENTANYL CITRATE (PF) 100 MCG/2ML IJ SOLN
INTRAMUSCULAR | Status: DC | PRN
  Administered 2023-03-11 (×2): 50 ug via INTRAVENOUS
  Administered 2023-03-11: 100 ug via INTRAVENOUS

## 2023-03-11 MED ORDER — METHYLENE BLUE 1 % INJ SOLN
INTRAVENOUS | Status: DC | PRN
  Administered 2023-03-11: 5 mL via SUBMUCOSAL

## 2023-03-11 MED ORDER — ACETAMINOPHEN 500 MG PO TABS
1000.0000 mg | ORAL_TABLET | ORAL | Status: AC
  Administered 2023-03-11: 1000 mg via ORAL

## 2023-03-11 MED ORDER — OXYCODONE HCL 5 MG PO TABS
ORAL_TABLET | ORAL | Status: AC
  Filled 2023-03-11: qty 1

## 2023-03-11 MED ORDER — DEXMEDETOMIDINE HCL IN NACL 80 MCG/20ML IV SOLN
INTRAVENOUS | Status: DC | PRN
  Administered 2023-03-11: 8 ug via INTRAVENOUS
  Administered 2023-03-11: 4 ug via INTRAVENOUS

## 2023-03-11 MED ORDER — MENTHOL 3 MG MT LOZG: 1.0000 | LOZENGE | OROMUCOSAL | Status: DC | PRN

## 2023-03-11 MED ORDER — DOCUSATE SODIUM 100 MG PO CAPS: 100.0000 mg | ORAL_CAPSULE | Freq: Two times a day (BID) | ORAL | Status: DC

## 2023-03-11 MED ORDER — ACETAMINOPHEN 500 MG PO TABS
1000.0000 mg | ORAL_TABLET | Freq: Four times a day (QID) | ORAL | Status: DC
  Administered 2023-03-11: 1000 mg via ORAL

## 2023-03-11 MED ORDER — ROCURONIUM BROMIDE 10 MG/ML (PF) SYRINGE
PREFILLED_SYRINGE | INTRAVENOUS | Status: AC
  Filled 2023-03-11: qty 10

## 2023-03-11 MED ORDER — PHENYLEPHRINE 80 MCG/ML (10ML) SYRINGE FOR IV PUSH (FOR BLOOD PRESSURE SUPPORT)
PREFILLED_SYRINGE | INTRAVENOUS | Status: DC | PRN
  Administered 2023-03-11: 80 ug via INTRAVENOUS
  Administered 2023-03-11: 160 ug via INTRAVENOUS
  Administered 2023-03-11 (×2): 80 ug via INTRAVENOUS
  Administered 2023-03-11: 160 ug via INTRAVENOUS
  Administered 2023-03-11: 80 ug via INTRAVENOUS

## 2023-03-11 MED ORDER — ONDANSETRON HCL 4 MG/2ML IJ SOLN
INTRAMUSCULAR | Status: DC | PRN
  Administered 2023-03-11: 4 mg via INTRAVENOUS

## 2023-03-11 MED ORDER — CELECOXIB 200 MG PO CAPS
400.0000 mg | ORAL_CAPSULE | ORAL | Status: AC
  Administered 2023-03-11: 400 mg via ORAL

## 2023-03-11 MED ORDER — ENSURE PRE-SURGERY PO LIQD: 296.0000 mL | Freq: Once | ORAL | Status: DC

## 2023-03-11 MED ORDER — MEPERIDINE HCL 25 MG/ML IJ SOLN: 6.2500 mg | INTRAMUSCULAR | Status: DC | PRN

## 2023-03-11 MED ORDER — OXYCODONE HCL 5 MG PO TABS: ORAL_TABLET | ORAL | 0 refills | Status: AC

## 2023-03-11 MED ORDER — LACTATED RINGERS IV SOLN: INTRAVENOUS | Status: DC

## 2023-03-11 MED ORDER — ENSURE PRE-SURGERY PO LIQD: 592.0000 mL | Freq: Once | ORAL | Status: DC

## 2023-03-11 MED ORDER — GABAPENTIN 300 MG PO CAPS
300.0000 mg | ORAL_CAPSULE | ORAL | Status: AC
  Administered 2023-03-11: 300 mg via ORAL

## 2023-03-11 MED ORDER — ACETAMINOPHEN 500 MG PO TABS: ORAL_TABLET | ORAL | 0 refills | Status: AC

## 2023-03-11 MED ORDER — IBUPROFEN 600 MG PO TABS: ORAL_TABLET | ORAL | 1 refills | Status: AC

## 2023-03-11 MED ORDER — OXYCODONE HCL 5 MG/5ML PO SOLN: 5.0000 mg | Freq: Once | ORAL | Status: DC | PRN

## 2023-03-11 MED ORDER — DEXAMETHASONE SODIUM PHOSPHATE 10 MG/ML IJ SOLN
INTRAMUSCULAR | Status: AC
  Filled 2023-03-11: qty 1

## 2023-03-11 MED ORDER — METHYLENE BLUE 1 % INJ SOLN
INTRAVENOUS | Status: AC
  Filled 2023-03-11: qty 10

## 2023-03-11 MED ORDER — ONDANSETRON HCL 4 MG PO TABS: 4.0000 mg | ORAL_TABLET | Freq: Four times a day (QID) | ORAL | Status: DC | PRN

## 2023-03-11 MED ORDER — MIDAZOLAM HCL 5 MG/5ML IJ SOLN
INTRAMUSCULAR | Status: DC | PRN
  Administered 2023-03-11: 2 mg via INTRAVENOUS

## 2023-03-11 MED ORDER — SIMETHICONE 80 MG PO CHEW
CHEWABLE_TABLET | ORAL | Status: AC
  Filled 2023-03-11: qty 1

## 2023-03-11 MED ORDER — CEFAZOLIN SODIUM-DEXTROSE 2-4 GM/100ML-% IV SOLN
INTRAVENOUS | Status: AC
  Filled 2023-03-11: qty 100

## 2023-03-11 MED ORDER — SUGAMMADEX SODIUM 200 MG/2ML IV SOLN
INTRAVENOUS | Status: DC | PRN
  Administered 2023-03-11: 200 mg via INTRAVENOUS
  Administered 2023-03-11: 100 mg via INTRAVENOUS

## 2023-03-11 MED ORDER — SIMETHICONE 80 MG PO CHEW
80.0000 mg | CHEWABLE_TABLET | Freq: Four times a day (QID) | ORAL | Status: DC | PRN
  Administered 2023-03-11: 80 mg via ORAL

## 2023-03-11 MED ORDER — ROCURONIUM BROMIDE 10 MG/ML (PF) SYRINGE
PREFILLED_SYRINGE | INTRAVENOUS | Status: DC | PRN
  Administered 2023-03-11: 10 mg via INTRAVENOUS
  Administered 2023-03-11: 100 mg via INTRAVENOUS

## 2023-03-11 MED ORDER — ONDANSETRON HCL 4 MG/2ML IJ SOLN: 4.0000 mg | Freq: Once | INTRAMUSCULAR | Status: DC | PRN

## 2023-03-11 MED ORDER — LIDOCAINE 2% (20 MG/ML) 5 ML SYRINGE
INTRAMUSCULAR | Status: DC | PRN
  Administered 2023-03-11: 100 mg via INTRAVENOUS

## 2023-03-11 MED ORDER — GABAPENTIN 300 MG PO CAPS
ORAL_CAPSULE | ORAL | Status: AC
  Filled 2023-03-11: qty 1

## 2023-03-11 MED ORDER — POVIDONE-IODINE 10 % EX SWAB: 2.0000 | Freq: Once | CUTANEOUS | Status: DC

## 2023-03-11 MED ORDER — ONDANSETRON HCL 4 MG/2ML IJ SOLN
INTRAMUSCULAR | Status: AC
  Filled 2023-03-11: qty 2

## 2023-03-11 MED ORDER — LIDOCAINE HCL (PF) 2 % IJ SOLN
INTRAMUSCULAR | Status: AC
  Filled 2023-03-11: qty 5

## 2023-03-11 MED ORDER — FENTANYL CITRATE (PF) 100 MCG/2ML IJ SOLN
25.0000 ug | INTRAMUSCULAR | Status: DC | PRN
  Administered 2023-03-11 (×2): 25 ug via INTRAVENOUS
  Administered 2023-03-11: 50 ug via INTRAVENOUS

## 2023-03-11 MED ORDER — MIDAZOLAM HCL 2 MG/2ML IJ SOLN
INTRAMUSCULAR | Status: AC
  Filled 2023-03-11: qty 2

## 2023-03-11 MED ORDER — OXYCODONE HCL 5 MG PO TABS
5.0000 mg | ORAL_TABLET | ORAL | Status: DC | PRN
  Administered 2023-03-11: 5 mg via ORAL

## 2023-03-11 MED ORDER — PROPOFOL 10 MG/ML IV BOLUS
INTRAVENOUS | Status: AC
  Filled 2023-03-11: qty 20

## 2023-03-11 MED ORDER — IBUPROFEN 200 MG PO TABS: 600.0000 mg | ORAL_TABLET | Freq: Four times a day (QID) | ORAL | Status: DC

## 2023-03-11 MED ORDER — CELECOXIB 200 MG PO CAPS
ORAL_CAPSULE | ORAL | Status: AC
  Filled 2023-03-11: qty 2

## 2023-03-11 MED ORDER — OXYCODONE HCL 5 MG PO TABS: 5.0000 mg | ORAL_TABLET | Freq: Once | ORAL | Status: DC | PRN

## 2023-03-11 SURGICAL SUPPLY — 79 items
ADH SKN CLS APL DERMABOND .7 (GAUZE/BANDAGES/DRESSINGS) ×1
APL SWBSTK 6 STRL LF DISP (MISCELLANEOUS) ×1
APPLICATOR COTTON TIP 6 STRL (MISCELLANEOUS) IMPLANT
APPLICATOR COTTON TIP 6IN STRL (MISCELLANEOUS) ×1
BARRIER ADHS 3X4 INTERCEED (GAUZE/BANDAGES/DRESSINGS) ×1 IMPLANT
BRR ADH 4X3 ABS CNTRL BYND (GAUZE/BANDAGES/DRESSINGS) ×1
CATH URETL OPEN 5X70 (CATHETERS) IMPLANT
CNTNR URN SCR LID CUP LEK RST (MISCELLANEOUS) IMPLANT
CONT SPEC 4OZ STRL OR WHT (MISCELLANEOUS) ×1
COVER BACK TABLE 60X90IN (DRAPES) ×1 IMPLANT
COVER TIP SHEARS 8 DVNC (MISCELLANEOUS) ×1 IMPLANT
DEFOGGER SCOPE WARMER CLEARIFY (MISCELLANEOUS) ×1 IMPLANT
DERMABOND ADVANCED .7 DNX12 (GAUZE/BANDAGES/DRESSINGS) ×1 IMPLANT
DRAPE ARM DVNC X/XI (DISPOSABLE) ×4 IMPLANT
DRAPE COLUMN DVNC XI (DISPOSABLE) ×1 IMPLANT
DRAPE SURG IRRIG POUCH 19X23 (DRAPES) ×1 IMPLANT
DRAPE UTILITY XL STRL (DRAPES) ×1 IMPLANT
DRIVER NDL MEGA SUTCUT DVNCXI (INSTRUMENTS) ×2 IMPLANT
DRIVER NDLE MEGA SUTCUT DVNCXI (INSTRUMENTS) ×1 IMPLANT
DURAPREP 26ML APPLICATOR (WOUND CARE) ×1 IMPLANT
ELECT REM PT RETURN 9FT ADLT (ELECTROSURGICAL) ×1
ELECTRODE REM PT RTRN 9FT ADLT (ELECTROSURGICAL) ×1 IMPLANT
FORCEPS BPLR 8 MD DVNC XI (FORCEP) IMPLANT
FORCEPS BPLR LNG DVNC XI (INSTRUMENTS) ×1 IMPLANT
FORCEPS LONG TIP 8 DVNC XI (FORCEP) ×1 IMPLANT
FORCEPS PROGRASP DVNC XI (FORCEP) IMPLANT
FORCEPS TENACULUM DVNC XI (FORCEP) ×1 IMPLANT
GAUZE 4X4 16PLY ~~LOC~~+RFID DBL (SPONGE) IMPLANT
GAUZE VASELINE 3X9 (GAUZE/BANDAGES/DRESSINGS) IMPLANT
GLOVE BIOGEL PI IND STRL 7.0 (GLOVE) ×4 IMPLANT
GLOVE ECLIPSE 6.5 STRL STRAW (GLOVE) ×3 IMPLANT
IRRIG SUCT STRYKERFLOW 2 WTIP (MISCELLANEOUS) ×1
IRRIGATION SUCT STRKRFLW 2 WTP (MISCELLANEOUS) ×1 IMPLANT
KIT PINK PAD W/HEAD ARE REST (MISCELLANEOUS) ×1
KIT PINK PAD W/HEAD ARM REST (MISCELLANEOUS) ×1 IMPLANT
KIT TURNOVER CYSTO (KITS) ×1 IMPLANT
LEGGING LITHOTOMY PAIR STRL (DRAPES) ×1 IMPLANT
MANIPULATOR UTERINE 4.5 ZUMI (MISCELLANEOUS) IMPLANT
NDL HYPO 22X1.5 SAFETY MO (MISCELLANEOUS) ×1 IMPLANT
NDL SPNL 22GX7 QUINCKE BK (NEEDLE) ×1 IMPLANT
NEEDLE HYPO 22X1.5 SAFETY MO (MISCELLANEOUS) ×1 IMPLANT
NEEDLE SPNL 22GX7 QUINCKE BK (NEEDLE) ×1 IMPLANT
NS IRRIG 1000ML POUR BTL (IV SOLUTION) ×1 IMPLANT
OBTURATOR OPTICAL STND 8 DVNC (TROCAR) ×1
OBTURATOR OPTICALSTD 8 DVNC (TROCAR) ×1 IMPLANT
PACK ROBOT WH (CUSTOM PROCEDURE TRAY) ×1 IMPLANT
PACK ROBOTIC GOWN (GOWN DISPOSABLE) ×1 IMPLANT
PAD PREP 24X48 CUFFED NSTRL (MISCELLANEOUS) ×1 IMPLANT
POUCH LAPAROSCOPIC INSTRUMENT (MISCELLANEOUS) ×1 IMPLANT
PROTECTOR NERVE ULNAR (MISCELLANEOUS) ×1 IMPLANT
SCISSORS MNPLR CVD DVNC XI (INSTRUMENTS) ×2 IMPLANT
SEAL UNIV 5-12 XI (MISCELLANEOUS) ×4 IMPLANT
SET TRI-LUMEN FLTR TB AIRSEAL (TUBING) ×1 IMPLANT
SLEEVE SCD COMPRESS KNEE MED (STOCKING) ×1 IMPLANT
SPIKE FLUID TRANSFER (MISCELLANEOUS) ×2 IMPLANT
STRIP CLOSURE SKIN 1/4X4 (GAUZE/BANDAGES/DRESSINGS) ×1 IMPLANT
SUT DVC VLOC 180 0 12IN GS21 (SUTURE)
SUT DVC VLOC 180 2-0 12IN GS21 (SUTURE)
SUT MNCRL AB 3-0 PS2 27 (SUTURE) ×2 IMPLANT
SUT VIC AB 0 CT2 27 (SUTURE) IMPLANT
SUT VICRYL 0 UR6 27IN ABS (SUTURE) ×1 IMPLANT
SUT VLOC 180 0 6IN GS21 (SUTURE) ×1 IMPLANT
SUT VLOC 180 0 9IN GS21 (SUTURE) IMPLANT
SUT VLOC 180 2-0 6IN GS21 (SUTURE) IMPLANT
SUT VLOC 180 2-0 9IN GS21 (SUTURE) IMPLANT
SUTURE DVC VL 180 2-0 12INGS21 (SUTURE) IMPLANT
SUTURE DVC VLC 180 0 12IN GS21 (SUTURE) IMPLANT
SYR 50ML LL SCALE MARK (SYRINGE) IMPLANT
SYS BAG RETRIEVAL 10MM (BASKET)
SYSTEM BAG RETRIEVAL 10MM (BASKET) IMPLANT
SYSTEM CARTER THOMASON II (TROCAR) IMPLANT
TIP UTERINE 5.1X6CM LAV DISP (MISCELLANEOUS) IMPLANT
TIP UTERINE 6.7X10CM GRN DISP (MISCELLANEOUS) IMPLANT
TIP UTERINE 6.7X6CM WHT DISP (MISCELLANEOUS) IMPLANT
TIP UTERINE 6.7X8CM BLUE DISP (MISCELLANEOUS) IMPLANT
TOWEL OR 17X24 6PK STRL BLUE (TOWEL DISPOSABLE) ×1 IMPLANT
TRAY FOLEY W/BAG SLVR 14FR LF (SET/KITS/TRAYS/PACK) ×1 IMPLANT
TROCAR PORT AIRSEAL 8X120 (TROCAR) ×1 IMPLANT
TROCAR Z-THREAD FIOS 11X100 BL (TROCAR) IMPLANT

## 2023-03-11 NOTE — Anesthesia Postprocedure Evaluation (Signed)
Anesthesia Post Note  Patient: Virginia [name]  Procedure(s) Performed: XI ROBOTIC ASSISTED MYOMECTOMY CHROMOPERTUBATION     Patient location during evaluation: PACU Anesthesia Type: General Level of consciousness: awake and alert Pain management: pain level controlled Vital Signs Assessment: post-procedure vital signs reviewed and stable Respiratory status: spontaneous breathing, nonlabored ventilation, respiratory function stable and patient connected to nasal cannula oxygen Cardiovascular status: blood pressure returned to baseline and stable Postop Assessment: no apparent nausea or vomiting Anesthetic complications: no   No notable events documented.  Last Vitals:  Vitals:   03/11/23 1700 03/11/23 1735  BP: 110/70 121/70  Pulse: (!) 56 60  Resp: 13 15  Temp: (!) 36.3 C 36.4 C  SpO2: 100% 100%    Last Pain:  Vitals:   03/11/23 1735  TempSrc:   PainSc: 8                  Trevor Iha

## 2023-03-11 NOTE — Discharge Instructions (Addendum)
Call [facility] at [phone] if:   You have a temperature greater than or equal to 100.4 degrees Farenheit orally You have pain that is not made better by the pain medication given and taken as directed You have excessive bleeding or problems urinating  Take Colace (Docusate Sodium/Stool Softener) 100 mg 2-3 times daily while taking narcotic pain medicine to avoid constipation or until bowel movements are regular.  Take Ibuprofen 600 mg with food and Acetaminophen #2-500 mg tablets every 6 hours for 5 days then as needed for pain  You may drive after 2 weeks You may walk up steps  You may shower tomorrow You may resume a regular diet  Keep incisions clean and dry Do not lift over 15 pounds for 6 weeks Avoid anything in vagina for 6 weeks    Post Anesthesia Home Care Instructions  Activity: Get plenty of rest for the remainder of the day. A responsible individual must stay with you for 24 hours following the procedure.  For the next 24 hours, DO NOT: -Drive a car -Advertising copywriter -Drink alcoholic beverages -Take any medication unless instructed by your physician -Make any legal decisions or sign important papers.  Meals: Start with liquid foods such as gelatin or soup. Progress to regular foods as tolerated. Avoid greasy, spicy, heavy foods. If nausea and/or vomiting occur, drink only clear liquids until the nausea and/or vomiting subsides. Call your physician if vomiting continues.  Special Instructions/Symptoms: Your throat may feel dry or sore from the anesthesia or the breathing tube placed in your throat during surgery. If this causes discomfort, gargle with warm salt water. The discomfort should disappear within 24 hours.  If you had a scopolamine patch placed behind your ear for the management of post- operative nausea and/or vomiting:  1. The medication in the patch is effective for 72 hours, after which it should be removed.  Wrap patch in a tissue and  discard in the trash. Wash hands thoroughly with soap and water. 2. You may remove the patch earlier than 72 hours if you experience unpleasant side effects which may include dry mouth, dizziness or visual disturbances. 3. Avoid touching the patch. Wash your hands with soap and water after contact with the patch.

## 2023-03-11 NOTE — Anesthesia Procedure Notes (Signed)
Procedure Name: Intubation Date/Time: 03/11/2023 12:53 PM  Performed by: Bishop Limbo, CRNAPre-anesthesia Checklist: Patient identified, Emergency Drugs available, Suction available and Patient being monitored Patient Re-evaluated:Patient Re-evaluated prior to induction Oxygen Delivery Method: Circle System Utilized Preoxygenation: Pre-oxygenation with 100% oxygen Induction Type: IV induction Ventilation: Mask ventilation without difficulty Laryngoscope Size: 3 and Mac Grade View: Grade I Tube type: Oral Tube size: 7.0 mm Number of attempts: 1 Airway Equipment and Method: Stylet and Bite block Placement Confirmation: ETT inserted through vocal cords under direct vision, positive ETCO2 and breath sounds checked- equal and bilateral Secured at: 22 cm Tube secured with: Tape Dental Injury: Teeth and Oropharynx as per pre-operative assessment

## 2023-03-11 NOTE — Op Note (Signed)
Preoperative diagnosis: Symptomatic uterine fibroids   Postoperative diagnosis: Same   Anesthesia: General   Anesthesiologist: Dr. Tacy Dura  Procedure: Robotically assisted myomectomy   Surgeon: Dr. Dois Davenport Sherly Brodbeck   Assistant: Henreitta Leber P.A.-C .  Estimated blood loss: 25 cc   Procedure:   After being informed of the planned procedure with possible complications including but not limited to bleeding, infection, injury to other organs, need for laparotomy, possible need for morcellation with risks and benefits reviewed, expected hospital stay and recovery, informed consent is obtained and patient is taken to or #5. She is placed in lithotomy position on Pink Pad with both arms padded and tucked on each side and bilateral knee-high sequential compressive devices. She is given general anesthesia with endotracheal intubation without any complication. She is prepped and draped in a sterile fashion. A three-way Foley catheter is inserted in her bladder.   Pelvic exam reveals: retroverted uterus bulky, mobile with 2 normal adnexa  A weighted speculum is inserted in the vagina and the anterior lip of the cervix is grasped with a tenaculum forcep. We proceed with a paracervical block and vaginal infiltration using ropivacaine 0.5% diluted 1 in 1 with saline. The uterus was then sounded at 8 cm. We easily dilate the cervix using Hegar dilator to #27 which allows for easy placement of the intrauterine RUMI manipulator.  Trocar placement is decided. We infiltrate the umbilicus with 10 cc of ropivacaine per protocol and perform a 10 mm semi elliptical incision which is brought down bluntly to the fascia. The fascia is identified and grasped with Coker forceps. The fascia is incised with Mayo scissors. Peritoneum is entered bluntly. A pursestring suture of 0 Vicryl is placed on the fascia and a 10 mm Hassan trocar is easily inserted in the abdominal cavity held in placed with a Purstring suture. This  allows for easy insufflation of a pneumoperitoneum using warmed CO2 at a maximum pressure of 15 mm of mercury. 60 cc of Ropivacaine 0.5 % diluted 1 in 1 is sent in the pelvis and the patient is positioned in reverse Trendelenburg. We then placed two 8mm robotic trocar on the left, one 8mm robotic trocar on the right and one 10 mm patient's side assistant trocar on the right after infiltrating every site with ropivacaine per protocol. The robot is docked on the right of the patient after positioning in Trendelenburg. A monopolar scissor is inserted in arm #4, a Long bipolar forceps is inserted in arm #2 and a Tenaculum is inserted in arm #1  Preparation and docking is completed in 51 minutes.   Observation: The uterus is enlarged with 4. Both tubes and ovaries are normal. Both anterior and posterior cul-de-sac are normal. Liver is visualized and normal. Appendix is not visualized.   Fibroid #1: Left cornual subserosal 5 cm with left tube involvement Fibroid #2: Right anterior pedunculated 1 cm Fibroid #3: Right cornual pedunculated 3 cm Fibroid #4: Right anterior pedunculated 3.5 cm  We proceed by infiltrating all the previously described fibroids using vasopressin at a dilution of 20 units in 100 cc of saline until we obtain complete blanching. Using monopolar scissors, the serosa of each fibroid is incised. The fibroid is grasped with Tenaculum. Using sharp and blunt dissection, the fibroid is extracted from the myometrium.   The myometrial defect of the left cornua is suspicious for tubal disruption despite a posterior approach. We attempt retrograde insertion of a 5 Fr ureteral catheter. It is stopped in the ampullary region. Chromopertubation confirms  intra-myometrial spillage likely from partial disruption of the tubal insertion. This superficial myometrial defect is repaired with a running sutures of 2-0  V-Lock. The serosal defect is closed with a baseball suture of 2-0 V-Lock.  We irrigated  profusely to confirm a satisfactory hemostasis. A sheet of Interceed covers the left serosal defect. Console  time is completed in 1 hour and 20 minutes.   Morcellation: we proceed with placing the 3 fibroids in Endocatch bag and remove them through the umbilical incision. 27 minutes.   All instruments are then removed and the robot is undocked.   We irrigate profusely and again confirm a satisfactory hemostasis.   All trochars are removed under direct visualization after evacuating the pneumoperitoneum.   The fascia of the supraumbilical incision is closed with the previously placed pursestring suture of 0 Vicryl. All incisions are then closed with subcuticular suture of 3-0 Monocryl and Dermabond.   A speculum is inserted in the vagina to confirm adequate hemostasis on the cervix.   Instrument and sponge count is complete x2. Estimated blood loss is 25 cc.   The procedure is well tolerated by the patient who  is taken to recovery room in a well and stable condition.    Dr Estanislado Pandy was present and scrubbed at all times. Surgical assistance was required due to the complexity of the anatomy and procedure.   NUMBER OF FIBROIDS REMOVED: 4  NUMBER OF MYOMETRIAL INCISIONS: 1  ENDOMETRIAL CAVITY ENTRY: no  CESAREAN DELIVERY RECOMMENDED: NO  Specimen: Fibroids sent to pathology.

## 2023-03-11 NOTE — Interval H&P Note (Signed)
History and Physical Interval Note:  03/11/2023 12:16 PM  Virginia [name]  has presented today for surgery, with the diagnosis of uterine fibroids with dysparenia.  The various methods of treatment have been discussed with the patient and family. After consideration of risks, benefits and other options for treatment, the patient has consented to  Procedure(s): XI ROBOTIC ASSISTED MYOMECTOMY (N/A) as a surgical intervention.  The patient's history has been reviewed, patient examined, no change in status, stable for surgery.  I have reviewed the patient's chart and labs.  Questions were answered to the patient's satisfaction.     Dois Davenport A Senaida Chilcote

## 2023-03-11 NOTE — Anesthesia Preprocedure Evaluation (Addendum)
Anesthesia Evaluation  Patient identified by MRN, date of birth, ID band Patient awake    Reviewed: Allergy & Precautions, H&P , NPO status , Patient's Chart, lab work & pertinent test results  Airway Mallampati: II  TM Distance: >3 FB Neck ROM: Full    Dental no notable dental hx. (+) Teeth Intact, Dental Advisory Given, Missing,    Pulmonary neg pulmonary ROS   Pulmonary exam normal breath sounds clear to auscultation       Cardiovascular Exercise Tolerance: Good negative cardio ROS Normal cardiovascular exam Rhythm:Regular Rate:Normal     Neuro/Psych  Headaches PSYCHIATRIC DISORDERS Anxiety Depression     Neuromuscular disease negative neurological ROS  negative psych ROS   GI/Hepatic negative GI ROS, Neg liver ROS,,,  Endo/Other  negative endocrine ROS    Renal/GU negative Renal ROS  negative genitourinary   Musculoskeletal negative musculoskeletal ROS (+)    Abdominal   Peds negative pediatric ROS (+)  Hematology negative hematology ROS (+) Blood dyscrasia, anemia   Anesthesia Other Findings   Reproductive/Obstetrics negative OB ROS                             Anesthesia Physical Anesthesia Plan  ASA: 2  Anesthesia Plan: General   Post-op Pain Management: Tylenol PO (pre-op)* and Celebrex PO (pre-op)*   Induction: Intravenous  PONV Risk Score and Plan: 3 and Ondansetron, Dexamethasone and Treatment may vary due to age or medical condition  Airway Management Planned: Oral ETT  Additional Equipment: None  Intra-op Plan:   Post-operative Plan: Extubation in OR  Informed Consent: I have reviewed the patients History and Physical, chart, labs and discussed the procedure including the risks, benefits and alternatives for the proposed anesthesia with the patient or authorized representative who has indicated his/her understanding and acceptance.       Plan Discussed with:  Anesthesiologist and CRNA  Anesthesia Plan Comments: (  )       Anesthesia Quick Evaluation

## 2023-03-11 NOTE — Transfer of Care (Signed)
Immediate Anesthesia Transfer of Care Note  Patient: Virginia [name]  Procedure(s) Performed: XI ROBOTIC ASSISTED MYOMECTOMY CHROMOPERTUBATION  Patient Location: PACU  Anesthesia Type:General  Level of Consciousness: awake, alert , oriented, and patient cooperative  Airway & Oxygen Therapy: Patient Spontanous Breathing  Post-op Assessment: Report given to RN and Post -op Vital signs reviewed and stable  Post vital signs: Reviewed and stable  Last Vitals:  Vitals Value Taken Time  BP 127/84 03/11/23 1616  Temp 36.6 C 03/11/23 1613  Pulse 74 03/11/23 1619  Resp 17 03/11/23 1619  SpO2 100 % 03/11/23 1619  Vitals shown include unvalidated device data.  Last Pain:  Vitals:   03/11/23 1613  TempSrc:   PainSc: 0-No pain      Patients Stated Pain Goal: 4 (03/11/23 1044)  Complications: No notable events documented.

## 2023-03-12 ENCOUNTER — Encounter (HOSPITAL_BASED_OUTPATIENT_CLINIC_OR_DEPARTMENT_OTHER): Payer: Self-pay | Admitting: Obstetrics and Gynecology

## 2023-03-15 LAB — SURGICAL PATHOLOGY

## 2023-11-03 ENCOUNTER — Other Ambulatory Visit: Payer: Self-pay

## 2023-11-03 ENCOUNTER — Encounter: Payer: Self-pay | Admitting: Allergy

## 2023-11-03 ENCOUNTER — Other Ambulatory Visit: Payer: Self-pay | Admitting: Allergy

## 2023-11-03 ENCOUNTER — Ambulatory Visit: Payer: BC Managed Care – PPO | Admitting: Allergy

## 2023-11-03 VITALS — BP 112/76 | HR 91 | Temp 98.2°F | Resp 18 | Ht 61.25 in | Wt 192.9 lb

## 2023-11-03 DIAGNOSIS — J4530 Mild persistent asthma, uncomplicated: Secondary | ICD-10-CM | POA: Diagnosis not present

## 2023-11-03 DIAGNOSIS — L2089 Other atopic dermatitis: Secondary | ICD-10-CM | POA: Diagnosis not present

## 2023-11-03 DIAGNOSIS — J3089 Other allergic rhinitis: Secondary | ICD-10-CM | POA: Diagnosis not present

## 2023-11-03 DIAGNOSIS — H1013 Acute atopic conjunctivitis, bilateral: Secondary | ICD-10-CM

## 2023-11-03 MED ORDER — RYALTRIS 665-25 MCG/ACT NA SUSP: 2.0000 | Freq: Two times a day (BID) | NASAL | 5 refills | Status: AC | PRN

## 2023-11-03 MED ORDER — ALBUTEROL SULFATE HFA 108 (90 BASE) MCG/ACT IN AERS: 1.0000 | INHALATION_SPRAY | Freq: Four times a day (QID) | RESPIRATORY_TRACT | 0 refills | Status: AC | PRN

## 2023-11-03 MED ORDER — PULMICORT FLEXHALER 180 MCG/ACT IN AEPB: 1.0000 | INHALATION_SPRAY | Freq: Two times a day (BID) | RESPIRATORY_TRACT | 5 refills | Status: DC

## 2023-11-03 NOTE — Progress Notes (Signed)
 New Patient Note  RE: Virginia [name] MRN: [id number] DOB: May 12, 1981 Date of Office Visit: 11/03/2023  Primary care provider: Kline, Chianne, PA-C  Chief Complaint: Asthma  History of present illness: Virginia [name] is a 43 y.o. female presenting today for evaluation of asthma and allergies. Discussed the use of AI scribe software for clinical note transcription with the patient, who gave verbal consent to proceed.  She has a history of asthma since childhood and is experiencing increased use of her albuterol  inhaler. Her asthma is usually well-controlled, with infrequent flares, but she has noticed a recent increase in inhaler use, which she attributes to weather changes. She used her inhaler today due to shortness of breath and chest tightness, which was temporarily relieved by the inhaler. Weight gain seems to exacerbate her asthma symptoms, with a recent gain of 10 to 12 pounds. She has not been hospitalized for asthma in the past year but used prednisone  a few weeks ago for sinus issues, which also helped with her breathing. Identified asthma triggers include wet clothes, colds, certain perfumes, and colognes. She has not used a daily inhaler in over ten years and has never used injection medications for asthma control.  She experiences allergy  symptoms, including itching, sneezing, and a stuffy nose, persistent over the last few months. She experiences sinus headaches, particularly since late November, with pressure in the back of her head and face. A recent CT scan was performed due to these headaches. She uses cetirizine for allergies and finds Advil  Cold & Sinus effective for sinus headaches, although it was less effective during severe episodes. She has used Flonase in the past but found it unhelpful. Her work environment is dry, contributing to epistaxis, which improved with an use of Bactroban ointment.  She has a history of eczema, with flare-ups last summer on her arms, legs,  thighs, and neck. She managed these with over-the-counter eczema cream, which was effective. No known food allergies but dislikes mayonnaise. She has not used eye drops or experienced ear symptoms recently, although she was told she had fluid in her ears.      11/01/23 ct sinus w con FINDINGS:  # Frontal sinuses: Clear bilaterally. Frontal recesses patent.  #  Ethmoid sinuses: Clear bilaterally.  #  Maxillary sinuses: Clear bilaterally. Ostiomeatal units patent bilaterally.  #  Sphenoid sinuses: Clear bilaterally.  #   #  Nasal cavity: Right-sided nasal septal spurring. Nasal turbinates are mildly atrophic.  #  Nasopharynx: Normal  #  Soft tissues: No significant abnormality seen.  #  Orbits: Grossly normal.  #  Visualized intracranial structures: No abnormality seen.   IMPRESSION:  1. Clear sinuses.  2.  Right-sided nasal septal spurring. Nasal turbinates are mildly atrophic.   Review of systems: 10pt ROS negative unless noted above in HPI  All other systems negative unless noted above in HPI  Past medical history: Past Medical History:  Diagnosis Date   Anemia    Follows with Virginia R. Circio, NP @ [facility]. Most recent iron infusion as of 02/23/23, was on 06/03/2022   Anxiety    Follows with Virginia [name], therapist at [facility].   Asthma    Follows w/ Virginia Hasten, PA @ [facility]. Patient states she uses her inhaler if she's doing a lot of walking or exercise.   Depression    Follows w/ therapist Virginia [name] @ [facility].   Eczema    Gastroparesis  Follows w/ Virginia Headland, PA @ [facility].   Headache    severe sinus headaches occasionally, hx of migraines   Hernia of abdominal wall    as a child   History of hypertension    Patient states that she no longer has hypertension, no meds.   IBS (irritable bowel syndrome)    Follows w/ GI.   Inguinal hernia    as a child   Neuromuscular disorder (HCC)     sciatica in lower back   Palpitations 06/09/2022   EKG normal, troponins negative, chest xray normal   Wears glasses     Past surgical history: Past Surgical History:  Procedure Laterality Date   CHROMOPERTUBATION N/A 03/11/2023   Procedure: CHROMOPERTUBATION;  Surgeon: Darcel Pool, MD;  Location: [facility];  Service: Gynecology;  Laterality: N/A;   COLONOSCOPY WITH ESOPHAGOGASTRODUODENOSCOPY (EGD)  10/03/2021   HERNIA REPAIR     as a child   ROBOT ASSISTED MYOMECTOMY N/A 03/11/2023   Procedure: XI ROBOTIC ASSISTED MYOMECTOMY;  Surgeon: Darcel Pool, MD;  Location: [facility];  Service: Gynecology;  Laterality: N/A;    Family history:  Family History  Problem Relation Age of Onset   Asthma Mother    Angioedema Mother    Asthma Sister    Angioedema Sister    Allergic rhinitis Sister     Social history: Lives in an apartment with carpeting with electric heating with an [facility] unit and fan. No pets in the home.  There is concern for Virginia [name]/mildew in the home.  No concern for roaches.  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  There is no smoking history.  Medication List: Current Outpatient Medications  Medication Sig Dispense Refill   acetaminophen  (TYLENOL ) 500 MG tablet Take 2 tablets po every 6 hours for 5 days then prn post operative pain 100 tablet 0   albuterol  (PROVENTIL  HFA;VENTOLIN  HFA) 108 (90 Base) MCG/ACT inhaler Inhale 1-2 puffs into the lungs every 6 (six) hours as needed for wheezing or shortness of breath. 1 Inhaler 0   cetirizine (ZYRTEC) 10 MG tablet Take 10 mg by mouth as needed for allergies.     cyclobenzaprine (FLEXERIL) 10 MG tablet Take 10 mg by mouth 2 (two) times daily as needed for muscle spasms.     dicyclomine (BENTYL) 20 MG tablet Take 20 mg by mouth as needed for spasms.     ibuprofen  (ADVIL ) 600 MG tablet take 1 tablet po pc every 6 hours for 5 days then prn post operative  pain 45 tablet 1   linaclotide (LINZESS) 72 MCG capsule Take 1 capsule by mouth daily.     mupirocin ointment (BACTROBAN) 2 % Apply 1 Application topically 3 (three) times daily.     omeprazole (PRILOSEC) 20 MG capsule Take 20 mg by mouth daily.     ondansetron  (ZOFRAN  ODT) 4 MG disintegrating tablet Allow 1-2 tablets to dissolve in your mouth every 8 hours as needed for nausea/vomiting 30 tablet 0   VIENVA 0.1-20 MG-MCG tablet Take 1 tablet by mouth daily.  3   cyanocobalamin (VITAMIN B12) 500 MCG tablet Take 500 mcg by mouth daily. (Patient not taking: Reported on 11/03/2023)     naproxen  (NAPROSYN ) 500 MG tablet Take 500 mg by mouth 2 (two) times daily as needed for moderate pain (pain score 4-6).     NON FORMULARY Take 1 tablet by mouth every 12 (twelve) hours as needed (headaches). (  Patient not taking: Reported on 11/03/2023)     oxyCODONE  (ROXICODONE ) 5 MG immediate release tablet take 1 tablet po every 6 hours prn for breakthrough post operative pain (Patient not taking: Reported on 11/03/2023) 30 tablet 0   No current facility-administered medications for this visit.    Known medication allergies: Allergies  Allergen Reactions   Bupropion Anxiety     Physical examination: Blood pressure 112/76, pulse 91, temperature 98.2 F (36.8 C), temperature source Temporal, resp. rate 18, height 5' 1.25 (1.556 m), weight 192 lb 14.4 oz (87.5 kg), SpO2 99%.  General: Alert, interactive, in no acute distress. HEENT: PERRLA, TMs pearly gray, turbinates moderately edematous with clear discharge, post-pharynx non erythematous. Neck: Supple without lymphadenopathy. Lungs: Clear to auscultation without wheezing, rhonchi or rales. {no increased work of breathing. CV: Normal S1, S2 without murmurs. Abdomen: Nondistended, nontender. Skin: Warm and dry, without lesions or rashes. Extremities:  No clubbing, cyanosis or edema. Neuro:   Grossly intact.  Diagnositics/Labs:  Spirometry: FEV1: 1.75L 76%,  FVC: 2.71L 97% predicted.  This shows mild obstruction.  Assessment and plan:   Asthma Childhood onset.  Recent increase in symptoms with weight gain and seasonal changes. Lung function tests show some degree of obstruction which is consistent with asthma - Daily controller medication(s): Pulmicort  Flexhaler 180mcg 1 puff twice daily - Prior to physical activity: albuterol  2 puffs 10-15 minutes before physical activity. - Rescue medications: albuterol  2 puffs every 4-6 hours as needed  - Asthma control goals:  * Full participation in all desired activities (may need albuterol  before activity) * Albuterol  use two time or less a week on average (not counting use with activity) * Cough interfering with sleep two time or less a month * Oral steroids no more than once a year * No hospitalizations  Allergic Rhinitis Symptoms of itching, sneezing, and stuffy nose. Recent sinusitis treated with prednisone . -Continue Zyrtec 10mg  daily as needed for symptom control. -Recommend Ryaltris  nasal spray 2 sprays each nostril twice a day as needed for runny or stuffy nose control.  This is a combination spray with Mometasone for congestion control and Olopatadine for drainage control.  With using nasal sprays point tip of bottle toward eye on same side nostril and lean head slightly forward for best technique.   -Pataday 1 drop each eye daily as needed for itchy/watery eyes -Consider skin testing to identify specific allergens and potential for allergy  shots.  Eczema History of dry, itchy patches on arms, legs, and neck. -Maintain daily moisturizing, especially after bathing. -Can use over-the-counter topical steroids during flare-ups like Hydrocortisone. If this is not effective let me know and can recommend another topical therapy   Follow-up Schedule for allergy  skin testing to identify specific allergens and potential for allergy  shots. Hold antihistamines for 3 days prior to skin testing visit.   Routine visit in 3-4 months   I appreciate the opportunity to take part in [facility] care. Please do not hesitate to contact me with questions.  Sincerely,   Danita Brain, MD Allergy /Immunology [facility]

## 2023-11-03 NOTE — Patient Instructions (Signed)
 Asthma Childhood onset.  Recent increase in symptoms with weight gain and seasonal changes. Lung function tests show some degree of obstruction which is consistent with asthma - Daily controller medication(s): Pulmicort  Flexhaler 180mcg 1 puff twice daily - Prior to physical activity: albuterol  2 puffs 10-15 minutes before physical activity. - Rescue medications: albuterol  2 puffs every 4-6 hours as needed  - Asthma control goals:  * Full participation in all desired activities (may need albuterol  before activity) * Albuterol  use two time or less a week on average (not counting use with activity) * Cough interfering with sleep two time or less a month * Oral steroids no more than once a year * No hospitalizations  Allergic Rhinitis Symptoms of itching, sneezing, and stuffy nose. Recent sinusitis treated with prednisone . -Continue Zyrtec 10mg  daily as needed for symptom control. -Recommend Ryaltris  nasal spray 2 sprays each nostril twice a day as needed for runny or stuffy nose control.  This is a combination spray with Mometasone for congestion control and Olopatadine for drainage control.  With using nasal sprays point tip of bottle toward eye on same side nostril and lean head slightly forward for best technique.   -Pataday 1 drop each eye daily as needed for itchy/watery eyes -Consider skin testing to identify specific allergens and potential for allergy  shots.  Eczema History of dry, itchy patches on arms, legs, and neck. -Maintain daily moisturizing, especially after bathing. -Can use over-the-counter topical steroids during flare-ups like Hydrocortisone. If this is not effective let me know and can recommend another topical therapy   Follow-up Schedule for allergy  skin testing to identify specific allergens and potential for allergy  shots. Hold antihistamines for 3 days prior to skin testing visit.  Routine visit in 3-4 months

## 2023-11-04 NOTE — Telephone Encounter (Signed)
 Pulmicort  is not covered asmanex hfa is. Is it ok to change?

## 2023-11-05 ENCOUNTER — Other Ambulatory Visit (HOSPITAL_COMMUNITY): Payer: Self-pay

## 2023-11-05 NOTE — Telephone Encounter (Signed)
 Pulmicort  Flexhaler IS covered by patient insurance. Co-pay is $0.00. Must be run as a 30 day supply - looks like it was in the system for 60 days which prevents it from being covered.

## 2023-11-10 ENCOUNTER — Ambulatory Visit: Payer: BC Managed Care – PPO | Admitting: Allergy

## 2023-11-10 ENCOUNTER — Encounter: Payer: Self-pay | Admitting: Allergy

## 2023-11-10 DIAGNOSIS — J3089 Other allergic rhinitis: Secondary | ICD-10-CM

## 2023-11-10 DIAGNOSIS — H1013 Acute atopic conjunctivitis, bilateral: Secondary | ICD-10-CM

## 2023-11-10 NOTE — Patient Instructions (Signed)
Asthma - Daily controller medication(s): Pulmicort Flexhaler 1 puff twice daily - Prior to physical activity: albuterol 2 puffs 10-15 minutes before physical activity. - Rescue medications: albuterol 2 puffs every 4-6 hours as needed  - Asthma control goals:  * Full participation in all desired activities (may need albuterol before activity) * Albuterol use two time or less a week on average (not counting use with activity) * Cough interfering with sleep two time or less a month * Oral steroids no more than once a year * No hospitalizations  Allergic Rhinitis Symptoms of itching, sneezing, and stuffy nose. Recent sinusitis treated with prednisone. -Continue Zyrtec 10mg  daily as needed for symptom control. -Use Ryaltris nasal spray 2 sprays each nostril twice a day as needed for runny or stuffy nose control.  This is a combination spray with Mometasone for congestion control and Olopatadine for drainage control.  With using nasal sprays point tip of bottle toward eye on same side nostril and lean head slightly forward for best technique.   -Pataday 1 drop each eye daily as needed for itchy/watery eyes  - Testing today showed: grasses, weeds, trees, indoor molds, outdoor molds, dust mites, and cat - Copy of test results provided.  - Avoidance measures provided. - Consider allergy shots as a means of long-term control. - Allergy shots "re-train" and "reset" the immune system to ignore environmental allergens and decrease the resulting immune response to those allergens (sneezing, itchy watery eyes, runny nose, nasal congestion, etc).    - Allergy shots improve symptoms in 75-85% of patients.  - We can discuss more at a future appointment if the medications are not working for you.  Eczema History of dry, itchy patches on arms, legs, and neck. -Maintain daily moisturizing, especially after bathing. -Can use over-the-counter topical steroids during flare-ups like Hydrocortisone. If  this is not effective let me know and can recommend another topical therapy  Follow-up Routine visit in 3-4 months

## 2023-11-10 NOTE — Progress Notes (Signed)
Marland Kitchen  inter

## 2023-11-10 NOTE — Progress Notes (Signed)
Follow-up Note  RE: Virginia [name] MRN: [id number] DOB: [birth year] Date of Office Visit: 11/10/2023   History of present illness: Virginia [name] is a 43 y.o. female presenting today for skin testing visit.  She was last seen in the office on 11/03/2023 by myself.  She has held all antihistamines for at least 3 days for testing today.  She is in her usual state of health today.  Medication List: Current Outpatient Medications  Medication Sig Dispense Refill   acetaminophen (TYLENOL) 500 MG tablet Take 2 tablets po every 6 hours for 5 days then prn post operative pain 100 tablet 0   albuterol (VENTOLIN HFA) 108 (90 Base) MCG/ACT inhaler Inhale 1-2 puffs into the lungs every 6 (six) hours as needed for wheezing or shortness of breath. 1 each 0   cetirizine (ZYRTEC) 10 MG tablet Take 10 mg by mouth as needed for allergies.     cyanocobalamin (VITAMIN B12) 500 MCG tablet Take 500 mcg by mouth daily. (Patient not taking: Reported on 11/03/2023)     cyclobenzaprine (FLEXERIL) 10 MG tablet Take 10 mg by mouth 2 (two) times daily as needed for muscle spasms.     dicyclomine (BENTYL) 20 MG tablet Take 20 mg by mouth as needed for spasms.     ibuprofen (ADVIL) 600 MG tablet take 1 tablet po pc every 6 hours for 5 days then prn post operative pain 45 tablet 1   linaclotide (LINZESS) 72 MCG capsule Take 1 capsule by mouth daily.     mupirocin ointment (BACTROBAN) 2 % Apply 1 Application topically 3 (three) times daily.     naproxen (NAPROSYN) 500 MG tablet Take 500 mg by mouth 2 (two) times daily as needed for moderate pain (pain score 4-6).     NON FORMULARY Take 1 tablet by mouth every 12 (twelve) hours as needed (headaches). (Patient not taking: Reported on 11/03/2023)     Olopatadine-Mometasone (RYALTRIS) 665-25 MCG/ACT SUSP Place 2 sprays into the nose 2 (two) times daily as needed (Runny or stuffy nose). 29 g 5   omeprazole (PRILOSEC) 20 MG capsule Take 20 mg by mouth daily.     ondansetron (ZOFRAN  ODT) 4 MG disintegrating tablet Allow 1-2 tablets to dissolve in your mouth every 8 hours as needed for nausea/vomiting 30 tablet 0   oxyCODONE (ROXICODONE) 5 MG immediate release tablet take 1 tablet po every 6 hours prn for breakthrough post operative pain (Patient not taking: Reported on 11/03/2023) 30 tablet 0   PULMICORT FLEXHALER 180 MCG/ACT inhaler INHALE 1 PUFF INTO THE LUNGS TWICE A DAY 1 each 5   VIENVA 0.1-20 MG-MCG tablet Take 1 tablet by mouth daily.  3   No current facility-administered medications for this visit.     Known medication allergies: Allergies  Allergen Reactions   Bupropion Anxiety    Diagnositics/Labs:  Allergy testing:   Airborne Adult Perc - 11/10/23 1411     Time Antigen Placed 0205    Allergen Manufacturer Waynette Buttery    Location Back    Number of Test 55    Panel 1 Select    1. Control-Buffer 50% Glycerol Negative    2. Control-Histamine 2+    3. Bahia Negative    4. French Southern Territories Negative    5. Johnson Negative    6. Kentucky Blue Negative    7. Meadow Fescue Negative    8. Perennial Rye Negative    9. Timothy Negative    10. Ragweed Mix Negative  11. Cocklebur Negative    12. Plantain,  English Negative    13. Baccharis Negative    14. Dog Fennel 2+    15. Russian Thistle Negative    16. Lamb's Quarters Negative    17. Sheep Sorrell Negative    18. Rough Pigweed Negative    19. Marsh Elder, Rough Negative    20. Mugwort, Common Negative    21. Box, Elder 3+    22. Cedar, red 2+    23. Sweet Gum 2+    24. Pecan Pollen Negative    25. Pine Mix Negative    26. Walnut, Black Pollen Negative    27. Red Mulberry Negative    28. Ash Mix Negative    29. Birch Mix 4+    30. Beech American 4+    31. Cottonwood, Guinea-Bissau Negative    32. Hickory, White Negative    33. Maple Mix Negative    34. Oak, [place] Mix 4+    35. Sycamore Eastern Negative    36. Alternaria Alternata 2+    37. Cladosporium Herbarum Negative    38. Aspergillus Mix Negative     39. Penicillium Mix Negative    40. Bipolaris Sorokiniana (Helminthosporium) Negative    41. Drechslera Spicifera (Curvularia) Negative    42. Mucor Plumbeus Negative    43. Fusarium Moniliforme Negative    44. Aureobasidium Pullulans (pullulara) Negative    45. Rhizopus Oryzae Negative    46. Botrytis Cinera Negative    47. Epicoccum Nigrum Negative    48. Phoma Betae Negative    49. Dust Mite Mix 2+    50. Cat Hair 10,000 BAU/ml Negative    51.  Dog Epithelia Negative    52. Mixed Feathers Negative    53. Horse Epithelia Negative    54. Cockroach, German Negative    55. Tobacco Leaf Negative             Intradermal - 11/10/23 1441     Time Antigen Placed 1441    Allergen Manufacturer Waynette Buttery    Location Arm    Number of Test 11    Control Negative    Bahia Negative    [place] 2+    7 Grass 2+    Ragweed Mix Negative    Mold 2 2+    Mold 3 2+    Mold 4 Negative    Cat 2+    Dog Negative    Cockroach Negative             Allergy testing results were read and interpreted by provider, documented by clinical staff.   Assessment and plan:   Asthma - Daily controller medication(s): Pulmicort Flexhaler 1 puff twice daily - Prior to physical activity: albuterol 2 puffs 10-15 minutes before physical activity. - Rescue medications: albuterol 2 puffs every 4-6 hours as needed  - Asthma control goals:  * Full participation in all desired activities (may need albuterol before activity) * Albuterol use two time or less a week on average (not counting use with activity) * Cough interfering with sleep two time or less a month * Oral steroids no more than once a year * No hospitalizations  Allergic Rhinitis Symptoms of itching, sneezing, and stuffy nose. Recent sinusitis treated with prednisone. -Continue Zyrtec 10mg  daily as needed for symptom control. -Use Ryaltris nasal spray 2 sprays each nostril twice a day as needed for runny or stuffy nose control.   This is a combination spray with  Mometasone for congestion control and Olopatadine for drainage control.  With using nasal sprays point tip of bottle toward eye on same side nostril and lean head slightly forward for best technique.   -Pataday 1 drop each eye daily as needed for itchy/watery eyes  - Testing today showed: grasses, weeds, trees, indoor molds, outdoor molds, dust mites, and cat - Copy of test results provided.  - Avoidance measures provided. - Consider allergy shots as a means of long-term control. - Allergy shots "re-train" and "reset" the immune system to ignore environmental allergens and decrease the resulting immune response to those allergens (sneezing, itchy watery eyes, runny nose, nasal congestion, etc).    - Allergy shots improve symptoms in 75-85% of patients.  - We can discuss more at a future appointment if the medications are not working for you.  Eczema History of dry, itchy patches on arms, legs, and neck. -Maintain daily moisturizing, especially after bathing. -Can use over-the-counter topical steroids during flare-ups like Hydrocortisone. If this is not effective let me know and can recommend another topical therapy  Follow-up Routine visit in 3-4 months    I appreciate the opportunity to take part in Virginia [name]'s care. Please do not hesitate to contact me with questions.  Sincerely,   Margo Aye, MD Allergy/Immunology [facility]

## 2023-12-07 ENCOUNTER — Other Ambulatory Visit: Payer: Self-pay | Admitting: Obstetrics and Gynecology

## 2023-12-07 DIAGNOSIS — Z1231 Encounter for screening mammogram for malignant neoplasm of breast: Secondary | ICD-10-CM

## 2024-01-13 ENCOUNTER — Encounter (INDEPENDENT_AMBULATORY_CARE_PROVIDER_SITE_OTHER): Payer: Self-pay

## 2024-01-13 ENCOUNTER — Ambulatory Visit (INDEPENDENT_AMBULATORY_CARE_PROVIDER_SITE_OTHER): Payer: BC Managed Care – PPO | Admitting: Otolaryngology

## 2024-01-13 VITALS — BP 113/79 | HR 92 | Ht 62.0 in | Wt 190.0 lb

## 2024-01-13 DIAGNOSIS — J0181 Other acute recurrent sinusitis: Secondary | ICD-10-CM

## 2024-01-13 DIAGNOSIS — R0981 Nasal congestion: Secondary | ICD-10-CM

## 2024-01-13 DIAGNOSIS — J3489 Other specified disorders of nose and nasal sinuses: Secondary | ICD-10-CM

## 2024-01-13 DIAGNOSIS — R0982 Postnasal drip: Secondary | ICD-10-CM | POA: Diagnosis not present

## 2024-01-13 DIAGNOSIS — J3089 Other allergic rhinitis: Secondary | ICD-10-CM

## 2024-01-13 DIAGNOSIS — J342 Deviated nasal septum: Secondary | ICD-10-CM | POA: Diagnosis not present

## 2024-01-13 DIAGNOSIS — J343 Hypertrophy of nasal turbinates: Secondary | ICD-10-CM

## 2024-01-13 NOTE — Progress Notes (Signed)
 Dear Dr. Aida House, Here is my assessment for our mutual patient, Virginia [name]. Thank you for allowing me the opportunity to care for your patient. Please do not hesitate to contact me should you have any other questions. Sincerely, Dr. Milon Aloe  Otolaryngology Clinic Note  HISTORY: Virginia [name] is a 43 y.o. female kindly referred by Dr. Aida House for evaluation of sinusitis  Initial visit (12/2023):Virginia [name]  She reports that starting in winter 2024, she started to have symptoms of facial pressure and headache (both sides, worse on right - bilateral max pressure) and congestion and PND. She denied discolored drainage from nose, hyposmia or dental pain. She reports that she had these symptoms for about a month, and was seen by her PCP, given antibiotics, steroids, and PO antihistamine. She reports that the abx/steroids did not help significantly, but eventually felt better about 2 weeks later. She did not use nasal sprays. She also had a CT thereafter which showed clear sinuses and septal deviation.  Right now, she denies any nasal symptoms, except for nasal congestion and obstruction (for past several months).  Prior to this episode, she denies any history of sinus infections ("never had one"). She also reports some seasonal allergy  symptoms with itching, sneezing, and congestion (since last fall). Improvement occurred with ryaltris  for these symptoms but not for her sinus infection in Jan. Allergy  testing has been done. No previous sinonasal surgery. She saw an ENT in past for nasal sx in [place]   She is currently using zyrtec, ryaltris  PRN. She is considering using allergy  shots.   GLP-1: no AP/AC: no  Tobacco: no  PMHx: AR, Eczema, Mild Asthma  RADIOGRAPHIC EVALUATION AND INDEPENDENT REVIEW OF OTHER RECORDS: Dr. Tempie Fee notes  (11/10/2023): noted AR sx, positive testing to grasses and molds; Rx: zyrtec, ryaltris , pataday CT Sinus 11/01/2023 unable to review images but report reviewed:  Skin  testing 11/10/2023 reviewed: positive to grass and molds  Referral notes reviewed and uploaded or available in chart in media tab [name] (PA-C) 10/05/2023: Noted sinus sx since Nov 2025; sneezing, coughing, sinus pressure, headhaces; left nostril congestion and obstruction; Dx: Sinusitis; Rx: Abx and pred Labs reviewed 10/22/2023:  CBC and BMP: WBC 6.8, Plt 295, Hgb 11.3/ BUN/Cr 14/0.69 Past Medical History:  Diagnosis Date   Anemia    Follows with Myles Arvin, NP @ [facility]. Most recent iron infusion as of 02/23/23, was on 06/03/2022   Anxiety    Follows with Alver Jobs, therapist at [facility].   Asthma    Follows w/ Stephens Eis, PA @ [facility]. Patient states she uses her inhaler if she's doing a lot of walking or exercise.   Depression    Follows w/ therapist Axel Lent @ [facility].   Eczema    Gastroparesis    Follows w/ Fortunato Ill, PA @ [facility].   Headache    severe sinus headaches occasionally, hx of migraines   Hernia of abdominal wall    as a child   History of hypertension    Patient states that she no longer has hypertension, no meds.   IBS (irritable bowel syndrome)    Follows w/ GI.   Inguinal hernia    as a child   Neuromuscular disorder (HCC)    sciatica in lower back   Palpitations 06/09/2022   EKG normal, troponins negative, chest xray normal   Wears glasses    Past Surgical History:  Procedure Laterality Date   CHROMOPERTUBATION  N/A 03/11/2023   Procedure: CHROMOPERTUBATION;  Surgeon: Ona Bidding, MD;  Location: [facility];  Service: Gynecology;  Laterality: N/A;   COLONOSCOPY WITH ESOPHAGOGASTRODUODENOSCOPY (EGD)  10/03/2021   HERNIA REPAIR     as a child   ROBOT ASSISTED MYOMECTOMY N/A 03/11/2023   Procedure: XI ROBOTIC ASSISTED MYOMECTOMY;  Surgeon: Ona Bidding, MD;  Location: [facility];  Service: Gynecology;  Laterality: N/A;    Family History  Problem Relation Age of Onset   Asthma Mother    Angioedema Mother    Asthma Sister    Angioedema Sister    Allergic rhinitis Sister    Social History   Tobacco Use   Smoking status: Never   Smokeless tobacco: Never  Substance Use Topics   Alcohol use: Yes    Comment: very rarely   Allergies  Allergen Reactions   Bupropion Anxiety   Current Outpatient Medications  Medication Sig Dispense Refill   acetaminophen  (TYLENOL ) 500 MG tablet Take 2 tablets po every 6 hours for 5 days then prn post operative pain 100 tablet 0   albuterol  (VENTOLIN  HFA) 108 (90 Base) MCG/ACT inhaler Inhale 1-2 puffs into the lungs every 6 (six) hours as needed for wheezing or shortness of breath. 1 each 0   cetirizine (ZYRTEC) 10 MG tablet Take 10 mg by mouth as needed for allergies.     cyanocobalamin (VITAMIN B12) 500 MCG tablet Take 500 mcg by mouth daily.     cyclobenzaprine (FLEXERIL) 10 MG tablet Take 10 mg by mouth 2 (two) times daily as needed for muscle spasms.     dicyclomine (BENTYL) 20 MG tablet Take 20 mg by mouth as needed for spasms.     ibuprofen  (ADVIL ) 600 MG tablet take 1 tablet po pc every 6 hours for 5 days then prn post operative pain 45 tablet 1   linaclotide (LINZESS) 72 MCG capsule Take 1 capsule by mouth daily.     mupirocin ointment (BACTROBAN) 2 % Apply 1 Application topically 3 (three) times daily.     naproxen  (NAPROSYN ) 500 MG tablet Take 500 mg by mouth 2 (two) times daily as needed for moderate pain (pain score 4-6).     NON FORMULARY Take 1 tablet by mouth every 12 (twelve) hours as needed (headaches).     Olopatadine-Mometasone (RYALTRIS ) 665-25 MCG/ACT SUSP Place 2 sprays into the nose 2 (two) times daily as needed (Runny or stuffy nose). 29 g 5   omeprazole (PRILOSEC) 20 MG capsule Take 20 mg by mouth daily.     ondansetron  (ZOFRAN  ODT) 4 MG disintegrating tablet Allow 1-2 tablets to dissolve in your mouth every 8 hours as needed for nausea/vomiting  30 tablet 0   oxyCODONE  (ROXICODONE ) 5 MG immediate release tablet take 1 tablet po every 6 hours prn for breakthrough post operative pain 30 tablet 0   VIENVA 0.1-20 MG-MCG tablet Take 1 tablet by mouth daily.  3   PULMICORT  FLEXHALER 180 MCG/ACT inhaler INHALE 1 PUFF INTO THE LUNGS TWICE A DAY (Patient not taking: Reported on 01/13/2024) 1 each 5   No current facility-administered medications for this visit.   BP 113/79 (BP Location: Left Arm, Patient Position: Sitting, Cuff Size: Large)   Pulse 92   Ht 5\' 2"  (1.575 m)   Wt 190 lb (86.2 kg)   SpO2 96%   BMI 34.75 kg/m   PHYSICAL EXAM:  BP 113/79 (BP Location: Left Arm, Patient Position: Sitting, Cuff Size: Large)   Pulse 92  Ht 5\' 2"  (1.575 m)   Wt 190 lb (86.2 kg)   SpO2 96%   BMI 34.75 kg/m    Salient findings:  CN II-XII intact Bilateral EAC clear and TM intact with well pneumatized middle ear spaces Nose: Anterior rhinoscopy reveals septal dev right, bilateral inferior turbinate hypertrophy.  Nasal endoscopy was indicated to better evaluate the nose and paranasal sinuses, given the patient's history and exam findings, and is detailed below. No lesions of oral cavity/oropharynx; dentition fair No obviously palpable neck masses/lymphadenopathy/thyromegaly No respiratory distress or stridor   PROCEDURE:  Prior to initiating any procedures, risks/benefits/alternatives were explained to the patient and verbal consent obtained. Diagnostic Nasal Endoscopy Pre-procedure diagnosis: Concern for recurrent sinusitis; nasal obstruction, nasal septal deviation Post-procedure diagnosis: same Indication: See pre-procedure diagnosis and physical exam above Complications: None apparent EBL: 0 mL Anesthesia: Lidocaine  4% and topical decongestant was topically sprayed in each nasal cavity  Description of Procedure:  Patient was identified. A rigid 30 degree endoscope was utilized to evaluate the sinonasal cavities, mucosa, sinus ostia and  turbinates and septum.  Overall, signs of mucosal inflammation are minimal but global mucosal edema noted..  No mucopurulence, polyps, or masses noted.   Right Middle meatus: clear Right SE Recess: clear Left MM: clear Left SE Recess: clear Noted right septal deviation, bilateral inferior turbinate hypertrophy    CPT CODE -- 31231 - Mod 25   ASSESSMENT:  43 y.o. with:  1. Other acute recurrent sinusitis   2. Nasal congestion   3. Hypertrophy of both inferior nasal turbinates   4. Nasal obstruction   5. Nasal septal deviation   6. Post-nasal drip   7. Perennial allergic rhinitis    PLAN: Feeling better from infection standpoint, and no purulence today. Prior CT without significant disease though cannot view images. We've discussed issues and options today. She does have AR with positive testing and significant congestion and obstruction in nose. We did discuss that given her septal deviation and turbinate hypertrophy, do think she would be a good candidate for septo/turbs and we discussed R/B/A for this. The risks, benefits and alternatives were discussed and questions answered.  She has elected to proceed with:  1) Continue sinus rinses 2) Continue ryaltris  BID 2) Follow-up in 4 weeks; if sx persist, can consider septo/turbs -- sooner as necessary.  See below regarding exact medications prescribed this encounter including dosages and route: No orders of the defined types were placed in this encounter.    Thank you for allowing me the opportunity to care for your patient. Please do not hesitate to contact me should you have any other questions.  Sincerely, Milon Aloe, MD Otolaryngologist (ENT), [facility] Phone: [phone] Fax: [phone]  I have personally spent 46 minutes involved in face-to-face and non-face-to-face activities for this patient on the day of the visit.  Professional time spent excludes any procedures performed but includes the  following activities, in addition to those noted in the documentation: preparing to see the patient (review of outside documentation and results), performing a medically appropriate examination, counseling, documenting in the electronic health record, interpreting and reviewing results   01/30/2024, 5:00 PM

## 2024-01-13 NOTE — Patient Instructions (Signed)
 Use ryaltris spray daily

## 2024-02-04 ENCOUNTER — Ambulatory Visit
Admission: RE | Admit: 2024-02-04 | Discharge: 2024-02-04 | Disposition: A | Discharge status: Home / Self Care | Source: Ambulatory Visit | Attending: Obstetrics and Gynecology | Admitting: Obstetrics and Gynecology

## 2024-02-04 DIAGNOSIS — Z1231 Encounter for screening mammogram for malignant neoplasm of breast: Secondary | ICD-10-CM

## 2024-02-09 ENCOUNTER — Ambulatory Visit (INDEPENDENT_AMBULATORY_CARE_PROVIDER_SITE_OTHER): Admitting: Otolaryngology

## 2024-02-09 ENCOUNTER — Encounter (INDEPENDENT_AMBULATORY_CARE_PROVIDER_SITE_OTHER): Payer: Self-pay | Admitting: Otolaryngology

## 2024-02-09 VITALS — BP 125/84 | HR 85 | Ht 62.0 in | Wt 190.0 lb

## 2024-02-09 DIAGNOSIS — J0181 Other acute recurrent sinusitis: Secondary | ICD-10-CM

## 2024-02-09 DIAGNOSIS — R0981 Nasal congestion: Secondary | ICD-10-CM | POA: Diagnosis not present

## 2024-02-09 DIAGNOSIS — J343 Hypertrophy of nasal turbinates: Secondary | ICD-10-CM | POA: Diagnosis not present

## 2024-02-09 DIAGNOSIS — J342 Deviated nasal septum: Secondary | ICD-10-CM

## 2024-02-09 DIAGNOSIS — J3489 Other specified disorders of nose and nasal sinuses: Secondary | ICD-10-CM

## 2024-02-09 DIAGNOSIS — J3089 Other allergic rhinitis: Secondary | ICD-10-CM

## 2024-02-09 DIAGNOSIS — R0982 Postnasal drip: Secondary | ICD-10-CM

## 2024-02-09 NOTE — Progress Notes (Signed)
 Dear Dr. Aida House, Here is my assessment for our mutual patient, Virginia [name]. Thank you for allowing me the opportunity to care for your patient. Please do not hesitate to contact me should you have any other questions. Sincerely, Dr. Milon Aloe  Otolaryngology Clinic Note  HISTORY: Virginia [name] is a 43 y.o. female kindly referred by Dr. Aida House for evaluation of sinusitis  Initial visit (12/2023):Virginia [name]  She reports that starting in winter 2024, she started to have symptoms of facial pressure and headache (both sides, worse on right - bilateral max pressure) and congestion and PND. She denied discolored drainage from nose, hyposmia or dental pain. She reports that she had these symptoms for about a month, and was seen by her PCP, given antibiotics, steroids, and PO antihistamine. She reports that the abx/steroids did not help significantly, but eventually felt better about 2 weeks later. She did not use nasal sprays. She also had a CT thereafter which showed clear sinuses and septal deviation.  Right now, she denies any nasal symptoms, except for nasal congestion and obstruction (for past several months).  Prior to this episode, she denies any history of sinus infections ("never had one"). She also reports some seasonal allergy  symptoms with itching, sneezing, and congestion (since last fall). Improvement occurred with ryaltris  for these symptoms but not for her sinus infection in Jan. Allergy  testing has been done. No previous sinonasal surgery. She saw an ENT in past for nasal sx in [place]   She is currently using zyrtec, ryaltris  PRN. She is considering using allergy  shots.  --------------------------------------------------------- 02/09/2024 Sleeping with breathe right strips. Still having some nasal obstruction.  Using ryaltris  regularly and it has helped some.  GLP-1: no AP/AC: no  Tobacco: no  PMHx: AR, Eczema, Mild Asthma  RADIOGRAPHIC EVALUATION AND INDEPENDENT REVIEW OF OTHER  RECORDS: Dr. Tempie Fee notes  (11/10/2023): noted AR sx, positive testing to grasses and molds; Rx: zyrtec, ryaltris , pataday CT Sinus 11/01/2023 unable to review images but report reviewed:  Skin testing 11/10/2023 reviewed: positive to grass and molds  Referral notes reviewed and uploaded or available in chart in media tab Virginia [name] (PA-C) 10/05/2023: Noted sinus sx since Nov 2025; sneezing, coughing, sinus pressure, headhaces; left nostril congestion and obstruction; Dx: Sinusitis; Rx: Abx and pred Labs reviewed 10/22/2023:  CBC and BMP: WBC 6.8, Plt 295, Hgb 11.3/ BUN/Cr 14/0.69 Past Medical History:  Diagnosis Date   Anemia    Follows with Myles Arvin, NP @ [facility]. Most recent iron infusion as of 02/23/23, was on 06/03/2022   Anxiety    Follows with Alver Jobs, therapist at [facility].   Asthma    Follows w/ Stephens Eis, PA @ [facility]. Patient states she uses her inhaler if she's doing a lot of walking or exercise.   Depression    Follows w/ therapist Axel Lent @ [facility].   Eczema    Gastroparesis    Follows w/ Fortunato Ill, PA @ [facility].   Headache    severe sinus headaches occasionally, hx of migraines   Hernia of abdominal wall    as a child   History of hypertension    Patient states that she no longer has hypertension, no meds.   IBS (irritable bowel syndrome)    Follows w/ GI.   Inguinal hernia    as a child   Neuromuscular disorder (HCC)    sciatica in lower back   Palpitations 06/09/2022   EKG normal, troponins  negative, chest xray normal   Wears glasses    Past Surgical History:  Procedure Laterality Date   CHROMOPERTUBATION N/A 03/11/2023   Procedure: CHROMOPERTUBATION;  Surgeon: Ona Bidding, MD;  Location: [facility] [place];  Service: Gynecology;  Laterality: N/A;   COLONOSCOPY WITH ESOPHAGOGASTRODUODENOSCOPY (EGD)  10/03/2021   HERNIA REPAIR     as a child   ROBOT  ASSISTED MYOMECTOMY N/A 03/11/2023   Procedure: XI ROBOTIC ASSISTED MYOMECTOMY;  Surgeon: Ona Bidding, MD;  Location: [facility] [place] [facility];  Service: Gynecology;  Laterality: N/A;   Family History  Problem Relation Age of Onset   Asthma Mother    Angioedema Mother    Asthma Sister    Angioedema Sister    Allergic rhinitis Sister    Social History   Tobacco Use   Smoking status: Never   Smokeless tobacco: Never  Substance Use Topics   Alcohol use: Yes    Comment: very rarely   Allergies  Allergen Reactions   Bupropion Anxiety   Current Outpatient Medications  Medication Sig Dispense Refill   acetaminophen  (TYLENOL ) 500 MG tablet Take 2 tablets po every 6 hours for 5 days then prn post operative pain 100 tablet 0   albuterol  (VENTOLIN  HFA) 108 (90 Base) MCG/ACT inhaler Inhale 1-2 puffs into the lungs every 6 (six) hours as needed for wheezing or shortness of breath. 1 each 0   cetirizine (ZYRTEC) 10 MG tablet Take 10 mg by mouth as needed for allergies.     cyanocobalamin (VITAMIN B12) 500 MCG tablet Take 500 mcg by mouth daily.     cyclobenzaprine (FLEXERIL) 10 MG tablet Take 10 mg by mouth 2 (two) times daily as needed for muscle spasms.     dicyclomine (BENTYL) 20 MG tablet Take 20 mg by mouth as needed for spasms.     ibuprofen  (ADVIL ) 600 MG tablet take 1 tablet po pc every 6 hours for 5 days then prn post operative pain 45 tablet 1   linaclotide (LINZESS) 72 MCG capsule Take 1 capsule by mouth daily.     mupirocin ointment (BACTROBAN) 2 % Apply 1 Application topically 3 (three) times daily.     naproxen  (NAPROSYN ) 500 MG tablet Take 500 mg by mouth 2 (two) times daily as needed for moderate pain (pain score 4-6).     NON FORMULARY Take 1 tablet by mouth every 12 (twelve) hours as needed (headaches).     Olopatadine-Mometasone (RYALTRIS ) 665-25 MCG/ACT SUSP Place 2 sprays into the nose 2 (two) times daily as needed (Runny or stuffy nose). 29 g 5   omeprazole  (PRILOSEC) 20 MG capsule Take 20 mg by mouth daily.     ondansetron  (ZOFRAN  ODT) 4 MG disintegrating tablet Allow 1-2 tablets to dissolve in your mouth every 8 hours as needed for nausea/vomiting 30 tablet 0   oxyCODONE  (ROXICODONE ) 5 MG immediate release tablet take 1 tablet po every 6 hours prn for breakthrough post operative pain 30 tablet 0   PULMICORT  FLEXHALER 180 MCG/ACT inhaler INHALE 1 PUFF INTO THE LUNGS TWICE A DAY 1 each 5   VIENVA 0.1-20 MG-MCG tablet Take 1 tablet by mouth daily.  3   Vitamin D, Ergocalciferol, (DRISDOL) 1.25 MG (50000 UNIT) CAPS capsule Take 50,000 Units by mouth.     No current facility-administered medications for this visit.   BP 125/84 (BP Location: Left Arm, Patient Position: Sitting, Cuff Size: Large)   Pulse 85   Ht 5\' 2"  (1.575 m)   Wt  190 lb (86.2 kg)   SpO2 97%   BMI 34.75 kg/m   PHYSICAL EXAM:  BP 125/84 (BP Location: Left Arm, Patient Position: Sitting, Cuff Size: Large)   Pulse 85   Ht 5\' 2"  (1.575 m)   Wt 190 lb (86.2 kg)   SpO2 97%   BMI 34.75 kg/m    Salient findings:  CN II-XII intact Bilateral EAC clear and TM intact with well pneumatized middle ear spaces Nose: Anterior rhinoscopy reveals septal dev right, bilateral inferior turbinate hypertrophy but mild improvement No lesions of oral cavity/oropharynx; dentition fair No obviously palpable neck masses/lymphadenopathy/thyromegaly No respiratory distress or stridor   PROCEDURE:  Prior to initiating any procedures, risks/benefits/alternatives were explained to the patient and verbal consent obtained. Diagnostic Nasal Endoscopy  Prior, not today Description of Procedure:  Patient was identified. A rigid 30 degree endoscope was utilized to evaluate the sinonasal cavities, mucosa, sinus ostia and turbinates and septum.  Overall, signs of mucosal inflammation are minimal but global mucosal edema noted..  No mucopurulence, polyps, or masses noted.   Right Middle meatus: clear Right  SE Recess: clear Left MM: clear Left SE Recess: clear Noted right septal deviation, bilateral inferior turbinate hypertrophy     ASSESSMENT:  43 y.o. with:  1. Other acute recurrent sinusitis   2. Nasal congestion   3. Hypertrophy of both inferior nasal turbinates   4. Nasal obstruction   5. Nasal septal deviation   6. Perennial allergic rhinitis   7. Post-nasal drip     PLAN: Feeling better from infection standpoint, and no purulence today. Prior CT without significant disease though cannot view images.   She does have AR with positive testing and significant congestion and obstruction in nose. However, this has improved some. We did discuss that given her septal deviation and turbinate hypertrophy, do think she would be a good candidate for septo/turbs and we discussed R/B/A for this. She would like to hold off for now and continue sprays. If it worsens, she will let us  know and at that point she would like to proceed with septo/turbs  1) Continue sinus rinses 2) Continue ryaltris  BID F/u as needed   See below regarding exact medications prescribed this encounter including dosages and route: No orders of the defined types were placed in this encounter.    Thank you for allowing me the opportunity to care for your patient. Please do not hesitate to contact me should you have any other questions.  Sincerely, Milon Aloe, MD Otolaryngologist (ENT), [facility] Phone: [phone] Fax: [phone]  MDM:  Level 3: 99213 Complexity/Problems addressed: mod Data complexity: low - Morbidity: low  - Prescription Drug prescribed or managed: no     02/29/2024, 7:46 AM
